# Patient Record
Sex: Male | Born: 1990 | Race: Black or African American | Hispanic: No | Marital: Single
Health system: Midwestern US, Community
[De-identification: ages and names within clinical notes are randomized; demographics above are authoritative.]

## PROBLEM LIST (undated history)

## (undated) DIAGNOSIS — F32A Depression, unspecified: Secondary | ICD-10-CM

## (undated) DIAGNOSIS — R569 Unspecified convulsions: Secondary | ICD-10-CM

## (undated) DIAGNOSIS — F419 Anxiety disorder, unspecified: Secondary | ICD-10-CM

## (undated) DIAGNOSIS — F329 Major depressive disorder, single episode, unspecified: Secondary | ICD-10-CM

---

## 2013-08-10 ENCOUNTER — Emergency Department (HOSPITAL_COMMUNITY)
Admission: EM | Admit: 2013-08-10 | Discharge: 2013-08-10 | Disposition: A | Discharge status: Home / Self Care | Payer: Medicaid Other | Source: Home / Self Care | Attending: Emergency Medicine | Admitting: Emergency Medicine

## 2013-08-10 ENCOUNTER — Emergency Department (HOSPITAL_COMMUNITY)
Admission: EM | Admit: 2013-08-10 | Discharge: 2013-08-10 | Disposition: A | Discharge status: Home / Self Care | Payer: Medicaid Other | Attending: Emergency Medicine | Admitting: Emergency Medicine

## 2013-08-10 ENCOUNTER — Encounter (HOSPITAL_COMMUNITY): Payer: Self-pay | Admitting: Emergency Medicine

## 2013-08-10 ENCOUNTER — Emergency Department (HOSPITAL_COMMUNITY): Payer: Medicaid Other

## 2013-08-10 DIAGNOSIS — R569 Unspecified convulsions: Secondary | ICD-10-CM

## 2013-08-10 DIAGNOSIS — IMO0001 Reserved for inherently not codable concepts without codable children: Secondary | ICD-10-CM

## 2013-08-10 DIAGNOSIS — Z8659 Personal history of other mental and behavioral disorders: Secondary | ICD-10-CM | POA: Insufficient documentation

## 2013-08-10 DIAGNOSIS — F1010 Alcohol abuse, uncomplicated: Secondary | ICD-10-CM | POA: Diagnosis not present

## 2013-08-10 DIAGNOSIS — Z79899 Other long term (current) drug therapy: Secondary | ICD-10-CM | POA: Insufficient documentation

## 2013-08-10 DIAGNOSIS — F1320 Sedative, hypnotic or anxiolytic dependence, uncomplicated: Secondary | ICD-10-CM | POA: Insufficient documentation

## 2013-08-10 DIAGNOSIS — F1210 Cannabis abuse, uncomplicated: Secondary | ICD-10-CM | POA: Insufficient documentation

## 2013-08-10 DIAGNOSIS — F1410 Cocaine abuse, uncomplicated: Secondary | ICD-10-CM

## 2013-08-10 DIAGNOSIS — F17200 Nicotine dependence, unspecified, uncomplicated: Secondary | ICD-10-CM | POA: Insufficient documentation

## 2013-08-10 DIAGNOSIS — G40909 Epilepsy, unspecified, not intractable, without status epilepticus: Secondary | ICD-10-CM | POA: Insufficient documentation

## 2013-08-10 DIAGNOSIS — F10929 Alcohol use, unspecified with intoxication, unspecified: Secondary | ICD-10-CM

## 2013-08-10 HISTORY — DX: Anxiety disorder, unspecified: F41.9

## 2013-08-10 HISTORY — DX: Depression, unspecified: F32.A

## 2013-08-10 HISTORY — DX: Unspecified convulsions: R56.9

## 2013-08-10 HISTORY — DX: Major depressive disorder, single episode, unspecified: F32.9

## 2013-08-10 LAB — URINALYSIS, ROUTINE W REFLEX MICROSCOPIC
Bilirubin Urine: NEGATIVE
GLUCOSE, UA: NEGATIVE mg/dL
Hgb urine dipstick: NEGATIVE
KETONES UR: NEGATIVE mg/dL
LEUKOCYTES UA: NEGATIVE
NITRITE: NEGATIVE
PH: 5.5 (ref 5.0–8.0)
Protein, ur: NEGATIVE mg/dL
SPECIFIC GRAVITY, URINE: 1.01 (ref 1.005–1.030)
Urobilinogen, UA: 0.2 mg/dL (ref 0.0–1.0)

## 2013-08-10 LAB — I-STAT VENOUS BLOOD GAS, ED
ACID-BASE DEFICIT: 4 mmol/L — AB (ref 0.0–2.0)
BICARBONATE: 22 meq/L (ref 20.0–24.0)
O2 Saturation: 62 %
TCO2: 23 mmol/L (ref 0–100)
pCO2, Ven: 41.5 mmHg — ABNORMAL LOW (ref 45.0–50.0)
pH, Ven: 7.332 — ABNORMAL HIGH (ref 7.250–7.300)
pO2, Ven: 34 mmHg (ref 30.0–45.0)

## 2013-08-10 LAB — CBC
HCT: 40.2 % (ref 39.0–52.0)
HEMOGLOBIN: 13.9 g/dL (ref 13.0–17.0)
MCH: 29.7 pg (ref 26.0–34.0)
MCHC: 34.6 g/dL (ref 30.0–36.0)
MCV: 85.9 fL (ref 78.0–100.0)
PLATELETS: 258 10*3/uL (ref 150–400)
RBC: 4.68 MIL/uL (ref 4.22–5.81)
RDW: 12.9 % (ref 11.5–15.5)
WBC: 13.9 10*3/uL — AB (ref 4.0–10.5)

## 2013-08-10 LAB — COMPREHENSIVE METABOLIC PANEL
ALT: 9 U/L (ref 0–53)
ANION GAP: 20 — AB (ref 5–15)
AST: 18 U/L (ref 0–37)
Albumin: 4.8 g/dL (ref 3.5–5.2)
Alkaline Phosphatase: 49 U/L (ref 39–117)
BUN: 10 mg/dL (ref 6–23)
CO2: 21 meq/L (ref 19–32)
CREATININE: 1.21 mg/dL (ref 0.50–1.35)
Calcium: 9.4 mg/dL (ref 8.4–10.5)
Chloride: 102 mEq/L (ref 96–112)
GFR calc Af Amer: >90 mL/min (ref >90)
GFR, EST NON AFRICAN AMERICAN: 83 mL/min — AB (ref >90)
GLUCOSE: 74 mg/dL (ref 70–99)
Potassium: 3.6 mEq/L — ABNORMAL LOW (ref 3.7–5.3)
Sodium: 143 mEq/L (ref 137–147)
Total Bilirubin: 0.6 mg/dL (ref 0.3–1.2)
Total Protein: 7.2 g/dL (ref 6.0–8.3)

## 2013-08-10 LAB — I-STAT CHEM 8, ED
BUN: 12 mg/dL (ref 6–23)
CALCIUM ION: 1.21 mmol/L (ref 1.12–1.23)
Chloride: 103 mEq/L (ref 96–112)
Creatinine, Ser: 1 mg/dL (ref 0.50–1.35)
GLUCOSE: 78 mg/dL (ref 70–99)
HCT: 47 % (ref 39.0–52.0)
HEMOGLOBIN: 16 g/dL (ref 13.0–17.0)
POTASSIUM: 3.4 meq/L — AB (ref 3.7–5.3)
Sodium: 140 mEq/L (ref 137–147)
TCO2: 21 mmol/L (ref 0–100)

## 2013-08-10 LAB — RAPID URINE DRUG SCREEN, HOSP PERFORMED
Amphetamines: NOT DETECTED
Barbiturates: NOT DETECTED
Benzodiazepines: POSITIVE — AB
COCAINE: POSITIVE — AB
Opiates: NOT DETECTED
Tetrahydrocannabinol: POSITIVE — AB

## 2013-08-10 LAB — I-STAT CG4 LACTIC ACID, ED: Lactic Acid, Venous: 1.2 mmol/L (ref 0.5–2.2)

## 2013-08-10 LAB — ETHANOL: ALCOHOL ETHYL (B): 146 mg/dL — AB (ref 0–11)

## 2013-08-10 LAB — PROLACTIN: Prolactin: 14.9 ng/mL (ref 2.1–17.1)

## 2013-08-10 LAB — CK: Total CK: 173 U/L (ref 7–232)

## 2013-08-10 MED ORDER — LEVETIRACETAM IN NACL 1000 MG/100ML IV SOLN
1000.0000 mg | Freq: Once | INTRAVENOUS | Status: AC
  Administered 2013-08-10: 1000 mg via INTRAVENOUS
  Filled 2013-08-10: qty 100

## 2013-08-10 MED ORDER — SODIUM CHLORIDE 0.9 % IV BOLUS (SEPSIS)
1000.0000 mL | Freq: Once | INTRAVENOUS | Status: AC
  Administered 2013-08-10: 1000 mL via INTRAVENOUS

## 2013-08-10 MED ORDER — LEVETIRACETAM 500 MG PO TABS: 500.0000 mg | ORAL_TABLET | Freq: Two times a day (BID) | ORAL | Status: DC

## 2013-08-10 MED ORDER — LEVETIRACETAM 500 MG PO TABS
500.0000 mg | ORAL_TABLET | Freq: Once | ORAL | Status: AC
  Administered 2013-08-10: 500 mg via ORAL
  Filled 2013-08-10: qty 1

## 2013-08-10 MED ORDER — LORAZEPAM 2 MG/ML IJ SOLN
2.0000 mg | INTRAMUSCULAR | Status: AC
  Administered 2013-08-10: 2 mg via INTRAVENOUS

## 2013-08-10 NOTE — ED Notes (Signed)
Patient was just discharged from facility one hour ago for a post ictal state after a seizure. Patient was found lying on the ground 3 blocks from ED. EMS report that there were no witnesses and patient cannot recall if he had a seizure or not. No incontinence. No airway compromise. CBG 68, 112/60, RR 16, HR 90

## 2013-08-10 NOTE — ED Notes (Signed)
Pt refusing to leave at discharge.  [employer] at bedside.  Pt awaken and given paper scrubs.  Discharge instructions read to pt. Pt verbalized understanding.  No e-signature obtained.

## 2013-08-10 NOTE — ED Notes (Signed)
MD at bedside. 

## 2013-08-10 NOTE — ED Notes (Signed)
Ambulated per NT Natasha B without difficulty.

## 2013-08-10 NOTE — Discharge Instructions (Signed)
Alcohol Intoxication °Alcohol intoxication occurs when you drink enough alcohol that it affects your ability to function. It can be mild or very severe. Drinking a lot of alcohol in a short time is called binge drinking. This can be very harmful. Drinking alcohol can also be more dangerous if you are taking medicines or other drugs. Some of the effects caused by alcohol may include: °· Loss of coordination. °· Changes in mood and behavior. °· Unclear thinking. °· Trouble talking (slurred speech). °· Throwing up (vomiting). °· Confusion. °· Slowed breathing. °· Twitching and shaking (seizures). °· Loss of consciousness. °HOME CARE °· Do not drive after drinking alcohol. °· Drink enough water and fluids to keep your pee (urine) clear or pale yellow. Avoid caffeine. °· Only take medicine as told by your doctor. °GET HELP IF: °· You throw up (vomit) many times. °· You do not feel better after a few days. °· You frequently have alcohol intoxication. Your doctor can help decide if you should see a substance use treatment counselor. °GET HELP RIGHT AWAY IF: °· You become shaky when you stop drinking. °· You have twitching and shaking. °· You throw up blood. It may look bright red or like coffee grounds. °· You notice blood in your poop (bowel movements). °· You become lightheaded or pass out (faint). °MAKE SURE YOU:  °· Understand these instructions. °· Will watch your condition. °· Will get help right away if you are not doing well or get worse. °Document Released: 07/06/2007 Document Revised: 09/19/2012 Document Reviewed: 06/22/2012 °ExitCare® Patient Information ©2015 [employer]. This information is not intended to replace advice given to you by your health care provider. Make sure you discuss any questions you have with your health care provider. ° °

## 2013-08-10 NOTE — ED Notes (Signed)
Pt actively seizing for 10 minutes, [facility] called to his home. Pt still seizing when they arrived. Pt received 2.5mg  of versed x 2. Pt complaining of neck pain. Lethargic, post ictal. NPA removed on arrival. Pt on NRB O2 sats 1001%. Family reports pt drank etoh and smoked a "blunt" .

## 2013-08-10 NOTE — ED Provider Notes (Signed)
CSN: [id number]     Arrival date & time    History   First MD Initiated Contact with Patient 08/10/13 0235     Chief Complaint  Patient presents with  . Seizures     (Consider location/radiation/quality/duration/timing/severity/associated sxs/prior Treatment) HPI This patient is a 23 yo man with a reported history of seizures. He is BIB EMS from his home after he developed seizure following alcohol and marijuana use. Family told EMS that the seizure had been ongoing for 15 to 66m by the time EMS arrived. Paramedics state that the patient was having [facility] activity on scene and was treated with Versed 2.5mg  IV. His seizure resolved. However, he had recurrent seizure activity en route which, again, resolved after tx with Versed 2.5mg  IV.   The patient had a third seizure immediately upon arrival to the ED. This seizure was successfully treated within 60s by administration of Ativan 2mg  IV.   The patient is now alert but oriented only to self. Despite being oriented and reoriented, he continues to ask "where am I?".  He has no recall of seizure activity. He denies pain to any region. Denies cocaine use. Denies use of an AED.   The patient began shaking his legs in the ED but, upon request, stopped voluntarily and could open his eyes and converse contemporaneously.     No past medical history on file. No past surgical history on file. No family history on file. History  Substance Use Topics  . Smoking status: Not on file  . Smokeless tobacco: Not on file  . Alcohol Use: Not on file    Review of Systems   UNABLE TO OBTAIN SECONDARY TO POST ICTAL STATE - LEVEL V CAVEAT  Allergies  Review of patient's allergies indicates not on file.  Home Medications   Prior to Admission medications   Not on File   BP 124/79  Pulse 112  Temp(Src) 97.5 F (36.4 C) (Oral)  Resp 13  SpO2 100% Physical Exam  Gen: well developed and well nourished appearing Head: NCAT Eyes: PERL, EOMI Nose:  no epistaixis or rhinorrhea Mouth/throat: mucosa is moist and pink Neck: supple, no stridor, cervical spine is nontender Chest: Chest wall is nontender, no crepitus or skin changes Lungs: CTA B, no wheezing, rhonchi or rales CV: RRR, no murmur, extremities appear well perfused.  Abd: soft, notender, nondistended Back: No tenderness palpation Skin: warm and dry Ext: Nontender, no signs of trauma normal to inspection, no dependent edema Neuro: CN ii-xii grossly intact, speech is slurred, patient can move all 4 extremities on command with fairly good strength. Psyche; normal affect,  calm and cooperative.  ED Course  Procedures (including critical care time) Labs Review  Results for orders placed during the hospital encounter of 08/10/13 (from the past 24 hour(s))  CK     Status: None   Collection Time    08/10/13  2:48 AM      Result Value Ref Range   Total CK 173  7 - 232 U/L  COMPREHENSIVE METABOLIC PANEL     Status: Abnormal   Collection Time    08/10/13  2:48 AM      Result Value Ref Range   Sodium 143  137 - 147 mEq/L   Potassium 3.6 (*) 3.7 - 5.3 mEq/L   Chloride 102  96 - 112 mEq/L   CO2 21  19 - 32 mEq/L   Glucose, Bld 74  70 - 99 mg/dL   BUN 10  6 -  23 mg/dL   Creatinine, Ser 1.611.21  0.50 - 1.35 mg/dL   Calcium 9.4  8.4 - 09.610.5 mg/dL   Total Protein 7.2  6.0 - 8.3 g/dL   Albumin 4.8  3.5 - 5.2 g/dL   AST 18  0 - 37 U/L   ALT 9  0 - 53 U/L   Alkaline Phosphatase 49  39 - 117 U/L   Total Bilirubin 0.6  0.3 - 1.2 mg/dL   GFR calc non Af Amer 83 (*) >90 mL/min   GFR calc Af Amer >90  >90 mL/min   Anion gap 20 (*) 5 - 15  CBC     Status: Abnormal   Collection Time    08/10/13  2:48 AM      Result Value Ref Range   WBC 13.9 (*) 4.0 - 10.5 K/uL   RBC 4.68  4.22 - 5.81 MIL/uL   Hemoglobin 13.9  13.0 - 17.0 g/dL   HCT 04.540.2  40.939.0 - 81.152.0 %   MCV 85.9  78.0 - 100.0 fL   MCH 29.7  26.0 - 34.0 pg   MCHC 34.6  30.0 - 36.0 g/dL   RDW 91.412.9  78.211.5 - 95.615.5 %   Platelets 258  150  - 400 K/uL  ETHANOL     Status: Abnormal   Collection Time    08/10/13  2:48 AM      Result Value Ref Range   Alcohol, Ethyl (B) 146 (*) 0 - 11 mg/dL  I-STAT VENOUS BLOOD GAS, ED     Status: Abnormal   Collection Time    08/10/13  2:56 AM      Result Value Ref Range   pH, Ven 7.332 (*) 7.250 - 7.300   pCO2, Ven 41.5 (*) 45.0 - 50.0 mmHg   pO2, Ven 34.0  30.0 - 45.0 mmHg   Bicarbonate 22.0  20.0 - 24.0 mEq/L   TCO2 23  0 - 100 mmol/L   O2 Saturation 62.0     Acid-base deficit 4.0 (*) 0.0 - 2.0 mmol/L   Sample type VENOUS     Comment NOTIFIED PHYSICIAN    URINALYSIS, ROUTINE W REFLEX MICROSCOPIC     Status: None   Collection Time    08/10/13  3:19 AM      Result Value Ref Range   Color, Urine YELLOW  YELLOW   APPearance CLEAR  CLEAR   Specific Gravity, Urine 1.010  1.005 - 1.030   pH 5.5  5.0 - 8.0   Glucose, UA NEGATIVE  NEGATIVE mg/dL   Hgb urine dipstick NEGATIVE  NEGATIVE   Bilirubin Urine NEGATIVE  NEGATIVE   Ketones, ur NEGATIVE  NEGATIVE mg/dL   Protein, ur NEGATIVE  NEGATIVE mg/dL   Urobilinogen, UA 0.2  0.0 - 1.0 mg/dL   Nitrite NEGATIVE  NEGATIVE   Leukocytes, UA NEGATIVE  NEGATIVE    MDM   Patient is s/p 3 episodes of spontaneous seizure. We are loading with Keppra. Labs notable only for mild leukocytosis. CK is wnl. Will continue to observe until MS has normalized and the patient is able to ambulate safely.   0444: UDS positive for cocaine. BAL elevated. Patient without recurrence of seizure activity. His MS and neurologic exams are normal. He pulled his IV out and has been ambulating around the ED asking to leave. He is stable for discharge. But, in light of his previously diagnosed seizure disorder, I have recommended to the patient that we start tx with Keppra. The  patient is also asked to schedule f/u with [facility] on Monday. Counseled re: return precautions.   Brandt Loosen, MD 08/10/13 [phone]

## 2013-08-10 NOTE — ED Notes (Signed)
[facility]. Pt has been seen in ED x3 in the past 24hrs for same. First dx was sleeping and 2nd dx seizures. Pt was prescribed Keppra but did not get medication filled. EMS arrived on scene and [employer] was called d/t family fighting and agitated. Pt during this time was Responsive to painful stimuli and when told to speak with EMS pt spoke. No postictal phase, pt A&O at this time. VS: BP-120/74 HR-100 CBG-72

## 2013-08-10 NOTE — ED Provider Notes (Signed)
CSN: [id number]     Arrival date & time 08/10/13  1406 History   First MD Initiated Contact with Patient 08/10/13 1413     Chief Complaint  Patient presents with  . Seizures     (Consider location/radiation/quality/duration/timing/severity/associated sxs/prior Treatment) HPI Comments: Possible seizure at home, no witnessed seizure activity  Patient is a 23 y.o. male presenting with seizures. The history is provided by the patient.  Seizures Seizure activity on arrival: no   Preceding symptoms: aura   Initial focality:  None Episode characteristics: fully responsive and responsive   Return to baseline: yes   Severity:  Mild Timing:  Once Progression:  Unchanged PTA treatment:  None History of seizures: yes   Severity:  Moderate Seizure control level:  Uncontrolled Current therapy:  None   Past Medical History  Diagnosis Date  . Seizures   . Anxiety   . Depression    History reviewed. No pertinent past surgical history. No family history on file. History  Substance Use Topics  . Smoking status: Current Every Day Smoker -- 0.50 packs/day    Types: Cigarettes  . Smokeless tobacco: Not on file  . Alcohol Use: Yes     Comment: "sometimes, every week or 2"    Review of Systems  Constitutional: Negative for fever.  Respiratory: Negative for cough and shortness of breath.   Gastrointestinal: Negative for vomiting.  Neurological: Positive for seizures.  All other systems reviewed and are negative.     Allergies  Review of patient's allergies indicates no known allergies.  Home Medications   Prior to Admission medications   Medication Sig Start Date End Date Taking? Authorizing Provider  levETIRAcetam (KEPPRA) 500 MG tablet Take 1 tablet (500 mg total) by mouth 2 (two) times daily. After the first week, increase to 1 and 1/2 tablets (750mg ) twice a day. 08/10/13   Brandt Loosen, MD   BP 114/69  Pulse 89  Temp(Src) 98 F (36.7 C) (Oral)  Resp 16  SpO2  100% Physical Exam  Nursing note and vitals reviewed. Constitutional: He is oriented to person, place, and time. He appears well-developed and well-nourished. No distress.  HENT:  Head: Normocephalic and atraumatic.  Mouth/Throat: Oropharynx is clear and moist. No oropharyngeal exudate.  Eyes: EOM are normal. Pupils are equal, round, and reactive to light.  Neck: Normal range of motion. Neck supple.  Cardiovascular: Normal rate and regular rhythm.  Exam reveals no friction rub.   No murmur heard. Pulmonary/Chest: Effort normal and breath sounds normal. No respiratory distress. He has no wheezes. He has no rales.  Abdominal: He exhibits no distension. There is no tenderness. There is no rebound.  Musculoskeletal: Normal range of motion. He exhibits no edema.  Neurological: He is alert and oriented to person, place, and time.  Skin: He is not diaphoretic.    ED Course  Procedures (including critical care time) Labs Review Labs Reviewed  I-STAT CHEM 8, ED - Abnormal; Notable for the following:    Potassium 3.4 (*)    All other components within normal limits  I-STAT CG4 LACTIC ACID, ED    Imaging Review Dg Chest Port 1 View  08/10/2013   CLINICAL DATA:  Seizure, aspiration  EXAM: PORTABLE CHEST - 1 VIEW  COMPARISON:  None.  FINDINGS: The heart size and mediastinal contours are within normal limits. Both lungs are clear. The visualized skeletal structures are unremarkable. Rightward curvature of the thoracic spine could be positional.  IMPRESSION: No active disease.   Electronically  Signed   By: Christiana PellantGretchen  Green M.D.   On: 08/10/2013 02:54     EKG Interpretation None      MDM   Final diagnoses:  Seizures    23 year old male with history of episodes the presents with possible seizure. He was seen earlier by me for the same. He was found awake after taking a nap and stated he thought he had a seizure. EMS stated patient was presented to be unconscious upon arrival in in he awoke  and spoke to them when they began talking to him. Apparently there was a lot of trauma in a house with people yelling, screaming, fighting. Patient has not filled his Keppra prescription given by Dr. Lavella LemonsManly. Patient given by mouth Here. Labs okay. Of note UDS last night showed positivity for alcohol, cocaine, marijuana. Here he is neurologically intact. Instructed he needs to lay off close to drugs and take his Keppra. Given neurology followup. Patient's family in the room and is comfortable with this plan.    Dagmar HaitWilliam Rodriquez Thorner, MD 08/10/13 1550

## 2013-08-10 NOTE — ED Notes (Signed)
Pts pupils brisk and reactive. Mild sternal rub and pt will open eyes. Not in post ictal state more so sleepy.

## 2013-08-10 NOTE — ED Provider Notes (Signed)
CSN: [id number]     Arrival date & time 08/10/13  [id number] History   First MD Initiated Contact with Patient 08/10/13 0720     Chief Complaint  Patient presents with  . Seizures     (Consider location/radiation/quality/duration/timing/severity/associated sxs/prior Treatment) HPI Comments: Found sleeping on side of road by EMS. Unknown if seizure occurred. Recently discharged (1 hour prior) for seizures - was loaded with Keppra. Hx of noncompliance with his tegretol.   Patient is a 23 y.o. male presenting with seizures. The history is provided by the patient.  Seizures Seizure activity on arrival: no   Seizure type: none. Preceding symptoms: no nausea   Initial focality:  None Return to baseline: yes   Severity:  Mild Progression:  Resolved   Past Medical History  Diagnosis Date  . Seizures    History reviewed. No pertinent past surgical history. No family history on file. History  Substance Use Topics  . Smoking status: Current Every Day Smoker  . Smokeless tobacco: Not on file  . Alcohol Use: Yes    Review of Systems  Constitutional: Negative for fever.  Respiratory: Negative for cough and shortness of breath.   Gastrointestinal: Negative for vomiting.  Neurological: Positive for seizures.  All other systems reviewed and are negative.     Allergies  Review of patient's allergies indicates not on file.  Home Medications   Prior to Admission medications   Medication Sig Start Date End Date Taking? Authorizing Provider  levETIRAcetam (KEPPRA) 500 MG tablet Take 1 tablet (500 mg total) by mouth 2 (two) times daily. After the first week, increase to 1 and 1/2 tablets (750mg ) twice a day. 08/10/13   Brandt LoosenJulie Manly, MD   BP 104/62  Pulse 80  Temp(Src) 97.2 F (36.2 C) (Oral)  Resp 20  SpO2 100% Physical Exam  Nursing note and vitals reviewed. Constitutional: He is oriented to person, place, and time. He appears well-developed and well-nourished. No distress.  HENT:   Head: Normocephalic and atraumatic.  Mouth/Throat: Oropharynx is clear and moist. No oropharyngeal exudate.  Eyes: EOM are normal. Pupils are equal, round, and reactive to light.  Neck: Normal range of motion. Neck supple.  Cardiovascular: Normal rate and regular rhythm.  Exam reveals no friction rub.   No murmur heard. Pulmonary/Chest: Effort normal and breath sounds normal. No respiratory distress. He has no wheezes. He has no rales.  Abdominal: Soft. He exhibits no distension. There is no tenderness. There is no rebound.  Musculoskeletal: Normal range of motion. He exhibits no edema.  Neurological: He is alert and oriented to person, place, and time. He exhibits normal muscle tone. Coordination normal.  Skin: No rash noted. He is not diaphoretic.    ED Course  Procedures (including critical care time) Labs Review Labs Reviewed - No data to display  Imaging Review Dg Chest Port 1 View  08/10/2013   CLINICAL DATA:  Seizure, aspiration  EXAM: PORTABLE CHEST - 1 VIEW  COMPARISON:  None.  FINDINGS: The heart size and mediastinal contours are within normal limits. Both lungs are clear. The visualized skeletal structures are unremarkable. Rightward curvature of the thoracic spine could be positional.  IMPRESSION: No active disease.   Electronically Signed   By: Christiana PellantGretchen  Green M.D.   On: 08/10/2013 02:54     EKG Interpretation   Date/Time:  Saturday August 10 2013 06:53:04 EDT Ventricular Rate:  89 PR Interval:  143 QRS Duration: 110 QT Interval:  401 QTC Calculation: 488 R Axis:  80 Text Interpretation:  Sinus rhythm Consider right atrial enlargement  Borderline prolonged QT interval Similar to prior Confirmed by Gwendolyn Grant  MD,  Dakota Vanwart ([id number]) on 08/10/2013 8:08:44 AM      MDM   Final diagnoses:  Sleeping    71M presents with possible seizure. He was seen previously by Dr. Lavella Lemons after having multiple seizures. Per her note, he had some voluntary seizure activity at a stop when  you talk to him. He reports noncompliance with his Tegretol over the past 6 months. He was loaded with Keppra and then given by mouth Keppra prescription to go home. He was found by EMS sleeping several blocks away from the hospital. There is unknown seizure activity. Patient reports he might have had a seizure. Here he is sleeping comfortably. He is easily arousable and follows commands. He has normal neuro exam here. Patient has discharge papers with his Keppra prescription. I believe patient was likely sleeping as he got multiple benzodiazepine doses. No seizure activity here with me. Stable for discharge.    Dagmar Hait, MD 08/10/13 [phone]

## 2013-08-10 NOTE — Discharge Instructions (Signed)
Nonepileptic Seizures °Nonepileptic seizures are seizures that are not caused by abnormal electrical signals in your brain. These seizures often seem like epileptic seizures, but they are not caused by epilepsy.  °There are two types of nonepileptic seizures: °· A physiologic nonepileptic seizure results from a disruption in your brain. °· A psychogenic seizure results from emotional stress. These seizures are sometimes called pseudoseizures. °CAUSES  °Causes of physiologic nonepileptic seizures include:  °· Sudden drop in blood pressure. °· Low blood sugar. °· Low levels of salt (sodium) in your blood. °· Low levels of calcium in your blood. °· Migraine. °· Heart rhythm problems. °· Sleep disorders. °· Drug and alcohol abuse. °Common causes of psychogenic nonepileptic seizures include: °· Stress. °· Emotional trauma. °· Sexual or physical abuse. °· Major life events, such as divorce or the death of a loved one. °· Mental health disorders, including panic attack and hyperactivity disorder. °SIGNS AND SYMPTOMS °A nonepileptic seizure can look like an epileptic seizure, including uncontrollable shaking (convulsions), or changes in attention, behavior, or the ability to remain awake and alert. However, there are some differences. Nonepileptic seizures usually: °· Do not cause physical injuries. °· Start slowly. °· Include crying or shrieking. °· Last longer than 2 minutes. °· Have a short recovery time without headache or exhaustion. °DIAGNOSIS  °Your health care provider can usually diagnose nonepileptic seizures after taking your medical history and giving you a physical exam. Your health care provider may want to talk to your friends or relatives who have seen you have a seizure.  °You may also need to have tests to look for causes of physiologic nonepileptic seizures. This may include an electroencephalogram (EEG), which is a test that measures electrical activity in your brain. If you have had an epileptic  seizure, the results of your EEG will be abnormal. If your health care provider thinks you have had a psychogenic nonepileptic seizure, you may need to see a mental health specialist for an evaluation. °TREATMENT  °Treatment depends on the type and cause of your seizures. °· For physiologic nonepileptic seizures, treatment is aimed at addressing the underlying condition that caused the seizures. These seizures usually stop when the underlying condition is properly treated. °· Nonepileptic seizures do not respond to the seizure medicines used to treat epilepsy. °· For psychogenic seizures, you may need to work with a mental health specialist. °HOME CARE INSTRUCTIONS °Home care will depend on the type of nonepileptic seizures you have.  °· Follow all your health care provider's instructions. °· Keep all your follow-up appointments. °SEEK MEDICAL CARE IF: °You continue to have seizures after treatment. °SEEK IMMEDIATE MEDICAL CARE IF: °· Your seizures change or become more frequent. °· You injure yourself during a seizure. °· You have one seizure after another. °· You have trouble recovering from a seizure. °· You have chest pain or trouble breathing. °MAKE SURE YOU: °· Understand these instructions. °· Will watch your condition. °· Will get help right away if you are not doing well or get worse. °Document Released: 03/04/2005 Document Revised: 01/22/2013 Document Reviewed: 11/13/2012 °[employer]® Patient Information ©2015 [employer]. This information is not intended to replace advice given to you by your health care provider. Make sure you discuss any questions you have with your health care provider. ° °

## 2014-12-26 ENCOUNTER — Encounter (HOSPITAL_COMMUNITY): Payer: Self-pay | Admitting: Vascular Surgery

## 2014-12-26 ENCOUNTER — Emergency Department (HOSPITAL_COMMUNITY)
Admission: EM | Admit: 2014-12-26 | Discharge: 2014-12-26 | Discharge status: Against Medical Advice | Payer: Medicaid Other | Attending: Emergency Medicine | Admitting: Emergency Medicine

## 2014-12-26 DIAGNOSIS — F17210 Nicotine dependence, cigarettes, uncomplicated: Secondary | ICD-10-CM | POA: Insufficient documentation

## 2014-12-26 DIAGNOSIS — R1084 Generalized abdominal pain: Secondary | ICD-10-CM | POA: Diagnosis not present

## 2014-12-26 LAB — CBC
HEMATOCRIT: 40.3 % (ref 39.0–52.0)
HEMOGLOBIN: 13.5 g/dL (ref 13.0–17.0)
MCH: 29.4 pg (ref 26.0–34.0)
MCHC: 33.5 g/dL (ref 30.0–36.0)
MCV: 87.8 fL (ref 78.0–100.0)
Platelets: 268 10*3/uL (ref 150–400)
RBC: 4.59 MIL/uL (ref 4.22–5.81)
RDW: 12.8 % (ref 11.5–15.5)
WBC: 20.8 10*3/uL — ABNORMAL HIGH (ref 4.0–10.5)

## 2014-12-26 LAB — COMPREHENSIVE METABOLIC PANEL
ALBUMIN: 4.2 g/dL (ref 3.5–5.0)
ALK PHOS: 61 U/L (ref 38–126)
ALT: 11 U/L — ABNORMAL LOW (ref 17–63)
ANION GAP: 8 (ref 5–15)
AST: 15 U/L (ref 15–41)
BUN: 8 mg/dL (ref 6–20)
CALCIUM: 9.3 mg/dL (ref 8.9–10.3)
CO2: 25 mmol/L (ref 22–32)
Chloride: 98 mmol/L — ABNORMAL LOW (ref 101–111)
Creatinine, Ser: 1.02 mg/dL (ref 0.61–1.24)
GFR calc non Af Amer: >60 mL/min (ref >60)
Glucose, Bld: 120 mg/dL — ABNORMAL HIGH (ref 65–99)
POTASSIUM: 3.5 mmol/L (ref 3.5–5.1)
SODIUM: 131 mmol/L — AB (ref 135–145)
TOTAL PROTEIN: 6.8 g/dL (ref 6.5–8.1)
Total Bilirubin: 0.7 mg/dL (ref 0.3–1.2)

## 2014-12-26 LAB — LIPASE, BLOOD: Lipase: 20 U/L (ref 11–51)

## 2014-12-26 NOTE — ED Notes (Signed)
Pt reports to the ED for eval of generalized abd pain that he first noticed today when he woke up. Pt reports associated N/V, intermittent dysuria, and chills. Denies diarrhea/hematemesis. Unknown fever. Pt A&OX4, resp e/u, and skin warm and dry.

## 2015-01-16 ENCOUNTER — Encounter (HOSPITAL_COMMUNITY): Payer: Self-pay | Admitting: Emergency Medicine

## 2015-01-16 ENCOUNTER — Emergency Department (HOSPITAL_COMMUNITY)
Admission: EM | Admit: 2015-01-16 | Discharge: 2015-01-17 | Disposition: A | Discharge status: Home / Self Care | Payer: Medicaid Other | Attending: Emergency Medicine | Admitting: Emergency Medicine

## 2015-01-16 DIAGNOSIS — S60511A Abrasion of right hand, initial encounter: Secondary | ICD-10-CM | POA: Diagnosis not present

## 2015-01-16 DIAGNOSIS — X58XXXA Exposure to other specified factors, initial encounter: Secondary | ICD-10-CM | POA: Insufficient documentation

## 2015-01-16 DIAGNOSIS — R000 Tachycardia, unspecified: Secondary | ICD-10-CM | POA: Insufficient documentation

## 2015-01-16 DIAGNOSIS — Y998 Other external cause status: Secondary | ICD-10-CM | POA: Insufficient documentation

## 2015-01-16 DIAGNOSIS — Y9389 Activity, other specified: Secondary | ICD-10-CM | POA: Insufficient documentation

## 2015-01-16 DIAGNOSIS — S0993XA Unspecified injury of face, initial encounter: Secondary | ICD-10-CM | POA: Diagnosis not present

## 2015-01-16 DIAGNOSIS — T1490XA Injury, unspecified, initial encounter: Secondary | ICD-10-CM

## 2015-01-16 DIAGNOSIS — S3992XA Unspecified injury of lower back, initial encounter: Secondary | ICD-10-CM | POA: Diagnosis not present

## 2015-01-16 DIAGNOSIS — F17210 Nicotine dependence, cigarettes, uncomplicated: Secondary | ICD-10-CM | POA: Diagnosis not present

## 2015-01-16 DIAGNOSIS — F10120 Alcohol abuse with intoxication, uncomplicated: Secondary | ICD-10-CM | POA: Diagnosis not present

## 2015-01-16 DIAGNOSIS — F10129 Alcohol abuse with intoxication, unspecified: Secondary | ICD-10-CM | POA: Diagnosis present

## 2015-01-16 DIAGNOSIS — F329 Major depressive disorder, single episode, unspecified: Secondary | ICD-10-CM | POA: Insufficient documentation

## 2015-01-16 DIAGNOSIS — F10920 Alcohol use, unspecified with intoxication, uncomplicated: Secondary | ICD-10-CM

## 2015-01-16 DIAGNOSIS — R569 Unspecified convulsions: Secondary | ICD-10-CM | POA: Insufficient documentation

## 2015-01-16 DIAGNOSIS — S50811A Abrasion of right forearm, initial encounter: Secondary | ICD-10-CM | POA: Diagnosis not present

## 2015-01-16 DIAGNOSIS — S6992XA Unspecified injury of left wrist, hand and finger(s), initial encounter: Secondary | ICD-10-CM | POA: Insufficient documentation

## 2015-01-16 DIAGNOSIS — Y9289 Other specified places as the place of occurrence of the external cause: Secondary | ICD-10-CM | POA: Diagnosis not present

## 2015-01-16 NOTE — ED Notes (Addendum)
Patient presents via EMS for ETOH and "seizure like activity". No oral trauma, no incontinence. No c/c.  18g left forearm, 500cc NS  Last VS 124/83, 92hr, 100%ra, 20resp, 98cbg

## 2015-01-16 NOTE — ED Provider Notes (Signed)
CSN: [id number]     Arrival date & time 01/16/15  2338 History  By signing my name below, I, Malik Hayes, attest that this documentation has been prepared under the direction and in the presence of Malik KaplanAnkit Axzel Rockhill, MD. Electronically Signed: [name], ED Scribe. 01/17/2015. 3:20 AM.      Chief Complaint  Patient presents with  . Alcohol Intoxication   Patient is a 24 y.o. male presenting with intoxication. The history is provided by the patient. No language interpreter was used.  Alcohol Intoxication   HPI Comments: Malik MedinaRyan Hayes is a 24 y.o. male brought in by ambulance, who presents to the Emergency Department complaining of ETOH and possible seizure like activity. Pt is not currently on taking seizure medication. Pt states that he has not been complaint with taking his Keppra - because allegedly Malik Hayes doctors asked him to stop it. Pt is complaining of hand pain and jaw pain but is unsure how he injured him self. Pt presents with several small abrasion to his right hand. Pt later states that he may have slid down a flight of steps. Pt states that he is unsure if he had a seizure. Pt denies any drug use today.   Per EMS report, pt was at a home with party going on, and he had seizure like activity.    Past Medical History  Diagnosis Date  . Seizures (HCC)   . Anxiety   . Depression    History reviewed. No pertinent past surgical history. No family history on file. Social History  Substance Use Topics  . Smoking status: Current Every Day Smoker -- 0.50 packs/day    Types: Cigarettes  . Smokeless tobacco: None  . Alcohol Use: Yes     Comment: "sometimes, every week or 2"    Review of Systems  Musculoskeletal: Positive for arthralgias.  Skin: Positive for wound.    ROS 10 Systems reviewed and are negative for acute change except as noted in the HPI.    Allergies  Review of patient's allergies indicates no known allergies.  Home Medications   Prior to Admission  medications   Medication Sig Start Date End Date Taking? Authorizing Provider  levETIRAcetam (KEPPRA) 500 MG tablet Take 1 tablet (500 mg total) by mouth 2 (two) times daily. 01/17/15   Lucita Montoya, MD   BP 108/62 mmHg  Pulse 95  Temp(Src) 98.3 F (36.8 C) (Oral)  Resp 22  SpO2 97%   Physical Exam  Constitutional: He is oriented to person, place, and time. He appears well-developed and well-nourished. No distress.  HENT:  Head: Normocephalic and atraumatic.  Mouth/Throat: No trismus in the jaw.  Tenderness over the TMJ bilaterally. No step off.  Eyes: Conjunctivae and EOM are normal.  Pupils 5 mm. No nystagmus.   Neck: Neck supple. No tracheal deviation present.  Cardiovascular: Tachycardia present.   No murmur heard. Pulmonary/Chest: Effort normal. No respiratory distress.  Musculoskeletal: Normal range of motion.  Pt has abrasion to the right forearm and right hand. Tenderness over the left thumb and left long finger, no gross deformity of left hand, no tenderness of wrist.  Tenderness over the small digit and the associated metacarpal region of the right hand.  Tenderness over the sacral spine with no step off, positive parapaspinal  tendeness in the same region with no ecchymosis. No tendeness with internmal and extreanl rotaion of the hips bilaterally   Neurological: He is alert and oriented to person, place, and time.  Skin: Skin is  warm and dry.  Psychiatric: He has a normal mood and affect. His behavior is normal.  Nursing note and vitals reviewed.   ED Course  Procedures (including critical care time) DIAGNOSTIC STUDIES: Oxygen Saturation is 99% on RA, normal by my interpretation.    COORDINATION OF CARE: 12:02 AM-Discussed treatment plan with pt at bedside and pt agreed to plan.  2:09 AM-Reassessed pt and spoke with pt's mother she agrees with plan to give IV Keppra and discharge. She reports hx of seizure like activity, but that pt was told that his seizures  might not be epilepsy or real seizures events. (?psuedoseizures). We discussed the treatment option of keppra bolus and rx with Neuro f/u VS no keppra and Neuro f/u with ER return if there is any repeat episodes. She prefers getting keppra on board as she isn't 100% sure on the seizure status.    Labs Review Labs Reviewed  CBC WITH DIFFERENTIAL/PLATELET - Abnormal; Notable for the following:    Hemoglobin 12.9 (*)    All other components within normal limits  BASIC METABOLIC PANEL - Abnormal; Notable for the following:    Chloride 113 (*)    Calcium 8.6 (*)    All other components within normal limits  ETHANOL - Abnormal; Notable for the following:    Alcohol, Ethyl (B) 204 (*)    All other components within normal limits    Imaging Review Dg Hand Complete Left  01/17/2015  CLINICAL DATA:  Bilateral hand pain with abrasions on the right hand. Possible seizure or fall. EXAM: LEFT HAND - COMPLETE 3+ VIEW COMPARISON:  None. FINDINGS: There is no evidence of fracture or dislocation. There is no evidence of arthropathy or other focal bone abnormality. Soft tissues are unremarkable. IMPRESSION: Negative. Electronically Signed   By: Burman Nieves M.D.   On: 01/17/2015 00:45   Dg Hand Complete Right  01/17/2015  CLINICAL DATA:  Right hand pain after injury. EXAM: RIGHT HAND - COMPLETE 3+ VIEW COMPARISON:  None. FINDINGS: No fracture or dislocation. The alignment and joint spaces are maintained. No radiopaque foreign body or focal soft tissue abnormality. IMPRESSION: No fracture or subluxation of the right hand. Electronically Signed   By: Rubye Oaks M.D.   On: 01/17/2015 00:47      EKG Interpretation None      MDM   Final diagnoses:  Trauma  Seizure-like activity (HCC)  Alcohol intoxication, uncomplicated (HCC)    I personally performed the services described in this documentation, which was scribed in my presence. The recorded information has been reviewed and is  accurate.  Pt comes in with cc of seizure like activity. Pt is not the best of historian, and he is intoxicated. He alleged that he was diagnosed with seizures, but then the [facility] doctors asked him to stop keppra as he was diagnosed with anxiety based on EEG. He hasnt been taking keppra for a while now, admits to having alcohol today. He has visible abrasion to his hands, and also c/o jaw pain - but he has no recollection how he had that. DDX: Syncope vs. Seizure.  Will get basic labs, load with keppra. We will starthim on keppra and give him neuro f.u.      Malik Kaplan, MD 01/17/15 [phone]

## 2015-01-17 ENCOUNTER — Emergency Department (HOSPITAL_COMMUNITY): Payer: Medicaid Other

## 2015-01-17 LAB — CBC WITH DIFFERENTIAL/PLATELET
Basophils Absolute: 0 10*3/uL (ref 0.0–0.1)
Basophils Relative: 0 %
EOS PCT: 2 %
Eosinophils Absolute: 0.1 10*3/uL (ref 0.0–0.7)
HCT: 39 % (ref 39.0–52.0)
HEMOGLOBIN: 12.9 g/dL — AB (ref 13.0–17.0)
LYMPHS PCT: 27 %
Lymphs Abs: 1.9 10*3/uL (ref 0.7–4.0)
MCH: 29.3 pg (ref 26.0–34.0)
MCHC: 33.1 g/dL (ref 30.0–36.0)
MCV: 88.4 fL (ref 78.0–100.0)
MONO ABS: 0.8 10*3/uL (ref 0.1–1.0)
Monocytes Relative: 11 %
NEUTROS ABS: 4.3 10*3/uL (ref 1.7–7.7)
NEUTROS PCT: 60 %
PLATELETS: 304 10*3/uL (ref 150–400)
RBC: 4.41 MIL/uL (ref 4.22–5.81)
RDW: 13.4 % (ref 11.5–15.5)
WBC: 7.1 10*3/uL (ref 4.0–10.5)

## 2015-01-17 LAB — BASIC METABOLIC PANEL
Anion gap: 8 (ref 5–15)
BUN: 8 mg/dL (ref 6–20)
CHLORIDE: 113 mmol/L — AB (ref 101–111)
CO2: 24 mmol/L (ref 22–32)
Calcium: 8.6 mg/dL — ABNORMAL LOW (ref 8.9–10.3)
Creatinine, Ser: 0.97 mg/dL (ref 0.61–1.24)
GFR calc Af Amer: >60 mL/min (ref >60)
GFR calc non Af Amer: >60 mL/min (ref >60)
GLUCOSE: 99 mg/dL (ref 65–99)
POTASSIUM: 3.8 mmol/L (ref 3.5–5.1)
Sodium: 145 mmol/L (ref 135–145)

## 2015-01-17 LAB — ETHANOL: Alcohol, Ethyl (B): 204 mg/dL — ABNORMAL HIGH (ref <5)

## 2015-01-17 MED ORDER — LEVETIRACETAM 500 MG PO TABS: 500.0000 mg | ORAL_TABLET | Freq: Two times a day (BID) | ORAL | Status: AC

## 2015-01-17 MED ORDER — SODIUM CHLORIDE 0.9 % IV SOLN
1000.0000 mg | Freq: Two times a day (BID) | INTRAVENOUS | Status: DC
  Administered 2015-01-17: 1000 mg via INTRAVENOUS
  Filled 2015-01-17: qty 10

## 2015-01-17 NOTE — Discharge Instructions (Signed)

## 2015-01-17 NOTE — ED Notes (Signed)
Patient is alert and oriented to self only. Able to ambulate steadily without assistance. Mother at bedside to transport home.

## 2015-12-06 ENCOUNTER — Inpatient Hospital Stay
Admit: 2015-12-06 | Discharge: 2015-12-09 | Disposition: A | Discharge status: Home / Self Care | Payer: MEDICARE | Source: Other Acute Inpatient Hospital | Attending: Psychiatry | Admitting: Psychiatry

## 2015-12-06 DIAGNOSIS — F329 Major depressive disorder, single episode, unspecified: Secondary | ICD-10-CM

## 2015-12-06 MED ORDER — BENZTROPINE 1 MG/ML IJ SOLN
1 mg/mL | Freq: Two times a day (BID) | INTRAMUSCULAR | Status: DC | PRN
Start: 2015-12-06 — End: 2015-12-09

## 2015-12-06 MED ORDER — OLANZAPINE 5 MG TAB
5 mg | Freq: Four times a day (QID) | ORAL | Status: DC | PRN
Start: 2015-12-06 — End: 2015-12-09
  Administered 2015-12-06 – 2015-12-07 (×2): via ORAL

## 2015-12-06 MED ORDER — LORAZEPAM 1 MG TAB
1 mg | ORAL | Status: DC | PRN
Start: 2015-12-06 — End: 2015-12-08
  Administered 2015-12-06 – 2015-12-08 (×4): via ORAL

## 2015-12-06 MED ORDER — WATER FOR INJECTION, STERILE INJECTION
20 mg/mL (final conc.) | Freq: Two times a day (BID) | INTRAMUSCULAR | Status: DC | PRN
Start: 2015-12-06 — End: 2015-12-09
  Administered 2015-12-08: 17:00:00 via INTRAMUSCULAR

## 2015-12-06 MED ORDER — ACETAMINOPHEN 325 MG TABLET
325 mg | ORAL | Status: DC | PRN
Start: 2015-12-06 — End: 2015-12-09

## 2015-12-06 MED ORDER — FLU VACCINE QV 2017-18 (36 MOS+)(PF) 60 MCG (15 MCG X 4)/0.5 ML IM SYRINGE
60 mcg (15 mcg x 4)/0.5 mL | INTRAMUSCULAR | Status: DC
Start: 2015-12-06 — End: 2015-12-09

## 2015-12-06 MED ORDER — LORAZEPAM 2 MG/ML IJ SOLN
2 mg/mL | INTRAMUSCULAR | Status: DC | PRN
Start: 2015-12-06 — End: 2015-12-09
  Administered 2015-12-08: 17:00:00 via INTRAMUSCULAR

## 2015-12-06 MED ORDER — BENZTROPINE 2 MG TAB
2 mg | Freq: Two times a day (BID) | ORAL | Status: DC | PRN
Start: 2015-12-06 — End: 2015-12-09

## 2015-12-06 MED ORDER — ZOLPIDEM 10 MG TAB
10 mg | Freq: Every evening | ORAL | Status: DC | PRN
Start: 2015-12-06 — End: 2015-12-09
  Administered 2015-12-09: 02:00:00 via ORAL

## 2015-12-06 MED ORDER — IBUPROFEN 400 MG TAB
400 mg | Freq: Three times a day (TID) | ORAL | Status: DC | PRN
Start: 2015-12-06 — End: 2015-12-09

## 2015-12-06 MED ORDER — NICOTINE 21 MG/24 HR DAILY PATCH
21 mg/24 hr | Freq: Every day | TRANSDERMAL | Status: DC | PRN
Start: 2015-12-06 — End: 2015-12-09

## 2015-12-06 MED ORDER — MAGNESIUM HYDROXIDE 400 MG/5 ML ORAL SUSP
400 mg/5 mL | Freq: Every day | ORAL | Status: DC | PRN
Start: 2015-12-06 — End: 2015-12-09

## 2015-12-06 MED FILL — LORAZEPAM 1 MG TAB: 1 mg | ORAL | Qty: 1

## 2015-12-06 MED FILL — OLANZAPINE 5 MG TAB: 5 mg | ORAL | Qty: 1

## 2015-12-06 MED FILL — FLUARIX QUAD 2017-2018 (PF) 60 MCG (15 MCG X 4)/0.5 ML IM SYRINGE: 60 mcg (15 mcg x 4)/0.5 mL | INTRAMUSCULAR | Qty: 0.5

## 2015-12-06 NOTE — Consults (Signed)
[facility]   [address]   [place], [place] [id number]   CONSULTATION       Name:  [name] MedinaBROWER, [name]   MR#:  [id number]   DOB:  [birth year]   Account #:  [id number]    Date of Consultation:  12/06/2015   Date of Adm:  12/06/2015       ATTENDING PHYSICIAN: Dr. Wallace CullensGray      PRESENTING COMPLAINT:  PSYCHE HISTORY & PHYSICAL    HISTORY OF PRESENTING COMPLAINT   The patient is a [age] year old male with a history of   depression and polysubstance abuse, who was admitted to psychiatric   inpatient for homicidal ideation. The patient stated that he was   homicidal towards his father's friend. At this time, the patient is not   suicidal or homicidal. He denied any chest pain, chest tightness,   shortness of breath, dyspnea on exertion, PND, or orthopnea. He also   denied diaphoresis, wheezing, or rhonchi. There is no palpitations,   irregular heartbeat, anxiety, or depression. The patient reports that he   takes Celexa for depression. The last time he took was 2 days ago.   The patient drinks alcohol 2 times a week as per patient account. He   usually drinks 4 to 5 beers when he drinks. He uses cocaine and   marijuana daily. The patient smokes about 5 cigarettes daily. There is   no report of cough, nasal congestion, sore throat, epistaxis, or   rhinorrhea. The patient has no complaints of fever, chills, or rigors. He   denied headache, lightheadedness, dizziness, blurry vision, double   vision, syncopal episode, or seizures. There is no neck pain, neck   stiffness, or confusion. The patient has no back pain or flank pain. He   denied dysuria, frequency, hematuria, or urethral discharge. There is   no lower extremity pain or swelling. There is no report of nausea,   vomiting, abdominal pain, constipation, or diarrhea. The patient has no   loss of appetite, dysphagia or odynophagia. He denied being   depressed or anxious at this time. The patient has no report of any skin   lesions or rash.    PAST MEDICAL HISTORY   1.   Depression.   2.  Tobacco abuse.   3.  Cocaine and marijuana abuse.   4.  Binges on alcohol occasionally.    MEDICATIONS   1.  Citalopram 40 mg daily.   2.  Hydroxyzine pamoate 25 mg q.8h. p.r.n. for anxiety.    ALLERGIES: NO KNOWN DRUG ALLERGIES.    SURGICAL HISTORY: None.    FAMILY HISTORY: Significant for diabetes, hypertension, and colon   cancer.     REVIEW OF SYSTEMS: More than 12 systems reviewed, and the   pertinent positives are in the history of present illness. All others are   negative.    PHYSICAL EXAMINATION   GENERAL: The patient is seen lying in bed in no acute respiratory   distress.   VITAL SIGNS: Blood pressure 112/84, pulse 69, respirations 18,   temperature 98.5, pulse oximetry 100% on room air.   HEAD, EYES, EARS, NOSE, AND THROAT: Atraumatic,   normocephalic. not pale, not jaundiced. Extraocular muscles intact.   Pupils bilaterally reactive, equal, round.   NECK: Supple. No JVD, no carotid bruit.   HEART: Sounds 1 and 2, regular rate and rhythm. No gallop, no   friction, no rub.   RESPIRATION: Good air entry bilaterally. Clear to auscultation.  ABDOMEN: Soft, nontender. Bowel sounds normoactive.   NEUROLOGIC: Alert, oriented x3. Cranial nerves 2-12 intact.   SKIN: Warm, dry, normal skin color, normal skin turgor.   PSYCHIATRIC: Normal mood, normal affect.   BACK: No CVA tenderness.   EXTREMITIES: No edema, no cyanosis. Normal range of motion.    DIAGNOSTIC TESTS: Acetaminophen less than 2, alcohol level 120.   WBC 9.64, hemoglobin 16.3, hematocrit 45.6, platelets 296. BUN 9,   creatinine 0.98, sodium 140, potassium 3.9, chloride 108, carbon   dioxide 25, glucose 118. ALT 22, AST 20, alkaline phosphatase 73.   CPK 259. Salicylate level 2. Troponin negative. Urine yellow, clear,   specific gravity 1.030, trace protein, negative glucose, negative nitrites,   negative leukocyte esterase. Urine drug screen is positive for cocaine   and cannabinoids.    ASSESSMENT   1.  Depression with anxiety.    2.  Tobacco abuse.   3.  Polysubstance abuse, including marijuana and cocaine.   4.  Alcohol binge history.    PLAN: Admission orders as per attending physician. The patient is   currently on Ativan p.r.n. He is on psychiatric medications including   Cogentin, Geodon, and Zyprexa. The patient is also on nicotine patch.   I agree with Tylenol p.r.n. Extensive patient education including   smoking cessation and counseling was done by bedside. Further   Pt education including polysubstance abuse and alcohol use was also   done by bedside. I encouraged the patient to drink adequate fluid   including water. We will obtain basic labs including CBC, CMP, and   TSH, as per protocol. Monitor vital signs, including blood pressure as   per unit protocol. Fall precautions, skin care precautions. Activity as   tolerated as per unit protocol. DVT prophylaxis as per attending   physician. I discussed advance directive with the patient. The father is   the next of kin. CODE IS FULL CODE. Further management will   depend on the patient's clinical progression, further evaluation by   attending physician, the results of tests, and recommendations by   consulting hospitalist. Had extensive discussion with the nursing staff.   They will call hospitalist team after obtaining the results of above labs.     Thank you for involving the hospitalist team in taking care of this very   nice patient.        Rutherford LimerickSAMUEL G. Genean Adamski, MD      SU / SB   D:  12/06/2015   03:10   T:  12/06/2015   10:00   Job #:  [id number]

## 2015-12-06 NOTE — Progress Notes (Signed)
.  The ending of Daylight Saving Time occurred at 0200 hrs. Documentation of patient care and medications administered is done with respect to the time change.

## 2015-12-06 NOTE — Behavioral Health Treatment Team (Signed)
Pt seen by Dr. Alphonsa GinUmesegha for H & P.

## 2015-12-06 NOTE — Behavioral Health Treatment Team (Addendum)
NSG ADMISSION NOTE:  Pt arrived to [facility] under a [place] TDO.  Pt is ambulatory with steady gait.  Pt is pleasant and cooperative.  Anxious with good eye contact.  Denies present suicide and homicide ideations.  Stated he [facility] because he was homicidal against his father's neighbor.  Pt stated if he went to the man's house he had "too many people there and I might get beat up so I went home and told them I needed to go to the hospital."  States he did not want to see anyone until "I got my meds straight."  Pt reports being admitted to IP psych "2 weeks ago" for cutting wrist. States "you can hardly see it now."  Reports he was prescribed Celexa and anti anxiety medication but stopped taking "2 days ago."    Out patient psych with Dr. Allena [place]Patel in [place].  Has no PCP.  Guarded with information surrounding drug use.  States he did not want to talk about it.  BAL 120 @ 10:08am yesterday in ED.  + cocaine, + THC.   Smokes cigarettes but states "it costs too much" and stated he needed to stop.  Benefits of smoking cessation discussed with patient and discussed handout provided in admission packet.    Pt is on disability and lives alone in his own one story home.  Has a father who lives in the area who is supportive but he did not want to talk to or see anyone [facility].  Oriented to unit.  Placed on q15 min checks for safety.

## 2015-12-06 NOTE — Behavioral Health Treatment Team (Signed)
1338: TDO hearing scheduled for Monday 12/07/15 at 0730. All parties have been notified.

## 2015-12-06 NOTE — Progress Notes (Signed)
Primary Nurse Cleta AlbertsPatricia L Hardwicke, RN and Darcel SmallingPat Brown, RN performed a dual skin assessment on this patient No impairment noted  Braden score is 23    One tattoo Rt upper arm      No pressure ulcer noted and pressure ulcer prevention initiated.

## 2015-12-06 NOTE — Consults (Signed)
[place] [facility]   [address]   [place], [place] [id number]   CONSULTATION       Name:  [name] Richardson, [name] Richardson   MR#:  [id number]   DOB:  10-18-1990   Account #:  [id number]    Date of Consultation:  12/06/2015   Date of Adm:  12/06/2015       ATTENDING PHYSICIAN: Dr. Wallace CullensGray      PRESENTING COMPLAINT:  PSYCHE HISTORY & PHYSICAL    HISTORY OF PRESENTING COMPLAINT   The patient is a 25 year old male with a history of   depression and polysubstance abuse, who was admitted to psychiatric   inpatient for homicidal ideation. The patient stated that he was   homicidal towards his father's friend. At this time, the patient is not   suicidal or homicidal. He denied any chest pain, chest tightness,   shortness of breath, dyspnea on exertion, PND, or orthopnea. He also   denied diaphoresis, wheezing, or rhonchi. There is no palpitations,   irregular heartbeat, anxiety, or depression. The patient reports that he   takes Celexa for depression. The last time he took was 2 days ago.   The patient drinks alcohol 2 times a week as per patient account. He   usually drinks 4 to 5 beers when he drinks. He uses cocaine and   marijuana daily. The patient smokes about 5 cigarettes daily. There is   no report of cough, nasal congestion, sore throat, epistaxis, or   rhinorrhea. The patient has no complaints of fever, chills, or rigors. He   denied headache, lightheadedness, dizziness, blurry vision, double   vision, syncopal episode, or seizures. There is no neck pain, neck   stiffness, or confusion. The patient has no back pain or flank pain. He   denied dysuria, frequency, hematuria, or urethral discharge. There is   no lower extremity pain or swelling. There is no report of nausea,   vomiting, abdominal pain, constipation, or diarrhea. The patient has no   loss of appetite, dysphagia or odynophagia. He denied being   depressed or anxious at this time. The patient has no report of any skin   lesions or rash.    PAST MEDICAL HISTORY    1.  Depression.   2.  Tobacco abuse.   3.  Cocaine and marijuana abuse.   4.  Binges on alcohol occasionally.    MEDICATIONS   1.  Citalopram 40 mg daily.   2.  Hydroxyzine pamoate 25 mg q.8h. p.r.n. for anxiety.    ALLERGIES: NO KNOWN DRUG ALLERGIES.    SURGICAL HISTORY: None.    FAMILY HISTORY: Significant for diabetes, hypertension, and colon   cancer.     REVIEW OF SYSTEMS: More than 12 systems reviewed, and the   pertinent positives are in the history of present illness. All others are   negative.    PHYSICAL EXAMINATION   GENERAL: The patient is seen lying in bed in no acute respiratory   distress.   VITAL SIGNS: Blood pressure 112/84, pulse 69, respirations 18,   temperature 98.5, pulse oximetry 100% on room air.   HEAD, EYES, EARS, NOSE, AND THROAT: Atraumatic,   normocephalic. not pale, not jaundiced. Extraocular muscles intact.   Pupils bilaterally reactive, equal, round.   NECK: Supple. No JVD, no carotid bruit.   HEART: Sounds 1 and 2, regular rate and rhythm. No gallop, no   friction, no rub.   RESPIRATION: Good air entry bilaterally. Clear to auscultation.  ABDOMEN: Soft, nontender. Bowel sounds normoactive.   NEUROLOGIC: Alert, oriented x3. Cranial nerves 2-12 intact.   SKIN: Warm, dry, normal skin color, normal skin turgor.   PSYCHIATRIC: Normal mood, normal affect.   BACK: No CVA tenderness.   EXTREMITIES: No edema, no cyanosis. Normal range of motion.    DIAGNOSTIC TESTS: Acetaminophen less than 2, alcohol level 120.   WBC 9.64, hemoglobin 16.3, hematocrit 45.6, platelets 296. BUN 9,   creatinine 0.98, sodium 140, potassium 3.9, chloride 108, carbon   dioxide 25, glucose 118. ALT 22, AST 20, alkaline phosphatase 73.   CPK 259. Salicylate level 2. Troponin negative. Urine yellow, clear,   specific gravity 1.030, trace protein, negative glucose, negative nitrites,   negative leukocyte esterase. Urine drug screen is positive for cocaine   and cannabinoids.    ASSESSMENT    1.  Depression with anxiety.   2.  Tobacco abuse.   3.  Polysubstance abuse, including marijuana and cocaine.   4.  Alcohol binge history.    PLAN: Admission orders as per attending physician. The patient is   currently on Ativan p.r.n. He is on psychiatric medications including   Cogentin, Geodon, and Zyprexa. The patient is also on nicotine patch.   I agree with Tylenol p.r.n. Extensive patient education including   smoking cessation and counseling was done by bedside. Further   Pt education including polysubstance abuse and alcohol use was also   done by bedside. I encouraged the patient to drink adequate fluid   including water. We will obtain basic labs including CBC, CMP, and   TSH, as per protocol. Monitor vital signs, including blood pressure as   per unit protocol. Fall precautions, skin care precautions. Activity as   tolerated as per unit protocol. DVT prophylaxis as per attending   physician. I discussed advance directive with the patient. The father is   the next of kin. CODE IS FULL CODE. Further management will   depend on the patient's clinical progression, further evaluation by   attending physician, the results of tests, and recommendations by   consulting hospitalist. Had extensive discussion with the nursing staff.   They will call hospitalist team after obtaining the results of above labs.     Thank you for involving the hospitalist team in taking care of this very   nice patient.        Rutherford LimerickSAMUEL G. Daneisha Surges, MD      SU / SB   D:  12/06/2015   03:10   T:  12/06/2015   10:00   Job #:  [id number]

## 2015-12-06 NOTE — Behavioral Health Treatment Team (Signed)
GRATITUDE GROUP THERAPY PROGRESS NOTE    The patient [name] MedinaRyan Erdmann a 25 y.o. male is participating in Gratitude Group.    Group time: 15 minutes    Personal goal for participation: Appreciation of all things big and small    Goal orientation: Gratitude exercises    Group therapy participation: active    Therapeutic interventions reviewed and discussed:  Yes    Impression of participation: Active participant.    Orlena Sheldonzra D Suber  12/06/2015  10:58 AM

## 2015-12-06 NOTE — Behavioral Health Treatment Team (Signed)
Pt [facility].    No items to hospital safe.    In 7ICU belongings closet:  1 pr tennis shoes; 1 pullover jacket with strings (pt no want strings cut), 1 pr pajama bottoms with string (pt no want string cut), 1 t-shirt.    [facility].  Pt changed into hospital gowns.  Wearing one pair of boxer shorts.

## 2015-12-06 NOTE — Other (Signed)
Dr. Forrestine HimEnoila advised of need for H & P via telephone.

## 2015-12-06 NOTE — Progress Notes (Signed)
Problem: Depressed Mood (Adult/Pediatric)  Goal: *STG: Remains safe in hospital  Outcome: Progressing Towards Goal  Pt admitted to [facility] under [place] TDO.  Placed on q15 min checks for safety.  Will continue to monitor and assess pt.

## 2015-12-06 NOTE — Progress Notes (Addendum)
Problem: Falls - Risk of Goal met by 12/10/15  Goal: *Absence of Falls  Document Schmid Fall Risk and appropriate interventions in the flowsheet.   Fall Risk Interventions:   Pt ambulates with a steady gait.          Medication Interventions: Teach patient to arise slowly                  Problem: Depressed Mood (Adult/Pediatric) Goal met by 12/10/15  Goal: *STG: Participates in treatment plan  Outcome: Progressing Towards Goal  Pt participated in treatment team. Pt states he doe snot want to go back home.  Pt denies AVH hallucinations.    Goal: *STG: Remains safe in hospital  Outcome: Progressing Towards Goal  Mood is anxious. Pt denies SI.

## 2015-12-06 NOTE — Progress Notes (Signed)
[id number]- PRN Medication Documentation    Specific patient behavior that led to need for PRN medication: pt alert oriented, restless, c/o increased anxiety following treatment team meeting  Staff interventions attempted prior to PRN being given: education, coping skill, deep breathing, therapeutic communication   PRN medication given: po 1 mg ativan  Patient response/effectiveness of PRN medication: pt calm visible on unit

## 2015-12-06 NOTE — Other (Addendum)
Behavioral Health Interdisciplinary Rounds     Patient Name: [name] MedinaRyan Richardson  Age: 25 y.o.  Room/Bed:  732/01  Primary Diagnosis: MDD (major depressive disorder)   Admission Status: TDO     Readmission within 30 days: no  Power of Attorney in place: no  Patient requires a blocked bed: no          Reason for blocked bed:     VTE Prophylaxis: Not indicated  Flu vaccine given : no   Mobility needs/Fall risk: no    Nutritional Plan: no  Consults:          Labs/Testing due today?: no    Sleep hours: 3.0       Participation in Care/Groups:  No - New Admission  Medication Compliant?: New admission - No scheduled meds  PRNS (last 24 hours): None    Restraints (last 24 hours):  no  Substance Abuse:  yes  CIWA (range last 24 hours):  COWS (range last 24 hours):   Alcohol screening (AUDIT) completed -     If applicable, date SBIRT discussed in treatment team AND documented:   Tobacco - patient is a smoker: yes   Date tobacco education completed by RN: 12/06/15  24 hour chart check complete: no - New Admission    Patient goal(s) for today:   Treatment team focus/goals:   Progress note     LOS:  0  Expected LOS:     Financial concerns/prescription coverage:    Date of last family contact:       Family requesting physician contact today:    Discharge plan:   Guns in the home:         Outpatient provider(s):     Participating treatment team members: [name] MedinaRyan Richardson, * (assigned SW),

## 2015-12-06 NOTE — Behavioral Health Treatment Team (Addendum)
[facility]  Master Treatment Plan [name] Richardson  ????  ????  ????  Date Treatment Plan Initiated:??12/06/2015  ????  ????  Treatment Plan Modalities:  ????  Type of Modality Amount  (x minutes) Frequency (x/week) Duration (x days) Name of Responsible Staff   Community & wrap-up meetings to encourage peer interactions ????  15 ????  7 ????  1 ????  [name],[name]   Group psychotherapy to assist in building coping skills and internal controls ????  60 ????  7 ????  1 ????  [name]   Therapeutic activity groups to build coping skills ????  60 ????  7 ????  1 ????  [name]   Psychoeducation in group setting to address:   Medication education ????  15 ????  7 ????  1 ????  Corrie DandyMary, ??RN   Coping skills ????  20 ????  7 ????  1 ????  [name], [name]   Relaxation techniques ???? ???? ???? ????   Symptom management ???? ???? ???? ????   Discharge planning ????  15 ????  7 ????  1 ????  [name], [name]   Spirituality  ????  60 ????  7 ????  1 ????  [name]   NAMI ????  60 ????  7 ????  1 ????  [name]   Recovery/AA/NA ????  60 ????  7 ????  1 ????  [name]   Physician medication management ????  15 ????  7 ????  1 ????  [name]   Family meeting/discharge planning ???? ???? ???? ????   ???? ???? ???? ???? ????   ????  ????  ????  ????

## 2015-12-06 NOTE — H&P (Signed)
INITIAL PSYCHIATRIC EVALUATION            IDENTIFICATION:    Patient Name  [name] Richardson   Date of Birth 11/07/1990   CSN [id number]   Medical Record Number  [id number]      Age  [age] y.o.   PCP None   Admit date:  12/06/2015    Room Number  728/02  @ [facility]   Date of Service  12/06/2015            HISTORY         REASON FOR HOSPITALIZATION:  CC: "". Pt admitted under a temporary detention order (TDO)  for severe depression with suicidal ideations proving to be an imminent danger to self and others and an inability to care for self.    HISTORY OF PRESENT ILLNESS:    The patient, [name] Richardson, is a 25 y.o.  BLACK OR [place] male with a past psychiatric history significant for depression and agitation , who presents at this time with complaints of (and/or evidence of) the following emotional symptoms: depression, agitation and psychotic behavior.  Additional symptomatology include alcohol abuse, anger outbursts, feeling depressed, poor concentration, problem with medication and relationship difficulties and homicidal ideation .  The above symptoms have been present for years . These symptoms are of moderate  severity. These symptoms are constant  in nature.  The patient's condition has been precipitated by problems with his father and psychosocial stressors (problems with his family  ).  Patient's condition made worse by continued illicit drug use and alcohol use as well as treatment noncompliance. UDS: positive ; BAL=0.      ALLERGIES: No Known Allergies   MEDICATIONS PRIOR TO ADMISSION:   Prescriptions Prior to Admission   Medication Sig   ??? citalopram (CELEXA) 40 mg tablet Take 40 mg by mouth daily. Indications: ANXIETY WITH DEPRESSION   ??? hydrOXYzine pamoate (VISTARIL) 25 mg capsule Take 25 mg by mouth every eight (8) hours as needed for Anxiety. Indications: anxiety      PAST MEDICAL HISTORY:   Past Medical History:   Diagnosis Date   ??? Anxiety disorder    ??? Depression     History reviewed. No pertinent surgical history.   SOCIAL HISTORY: single and using drugs    Social History     Social History   ??? Marital status: SINGLE     Spouse name: N/A   ??? Number of children: N/A   ??? Years of education: N/A     Occupational History   ??? Not on file.     Social History Main Topics   ??? Smoking status: Current Every Day Smoker     Packs/day: 0.25     Years: 11.00   ??? Smokeless tobacco: Never Used   ??? Alcohol use 1.2 - 3.0 oz/week     2 - 5 Cans of beer per week      Comment: 2 times a week   ??? Drug use: Yes     Special: Marijuana, Cocaine   ??? Sexual activity: Not on file     Other Topics Concern   ??? Not on file     Social History Narrative   ??? No narrative on file      FAMILY HISTORY: History reviewed. No pertinent family history.   History reviewed. No pertinent family history.    REVIEW OF SYSTEMS:   History obtained from the patient  Pertinent items are noted in the History of  Present Illness.  All other Systems reviewed and are considered negative.           MENTAL STATUS EXAM & VITALS     MENTAL STATUS EXAM (MSE):    MSE FINDINGS ARE WITHIN NORMAL LIMITS (WNL) UNLESS OTHERWISE STATED BELOW. ( ALL OF THE BELOW CATEGORIES OF THE MSE HAVE BEEN REVIEWED (reviewed 12/06/2015) AND UPDATED AS DEEMED APPROPRIATE )  General Presentation age appropriate, cooperative   Orientation oriented to time, place and person   Vital Signs  See below (reviewed 12/06/2015); Vital Signs (BP, Pulse, & Temp) are within normal limits if not listed below.   Gait and Station Stable/steady, no ataxia   Musculoskeletal System No extrapyramidal symptoms (EPS); no abnormal muscular movements or Tardive Dyskinesia (TD); muscle strength and tone are within normal limits   Language No aphasia or dysarthria   Speech:  normal pitch and normal volume   Thought Processes logical; normal rate of thoughts; poor abstract reasoning/computation   Thought Associations tangential   Thought Content free of delusions    Suicidal Ideations no intention   Homicidal Ideations no plan    Mood:  irritable   Affect:  anxious   Memory recent  fair   Memory remote:  fair   Concentration/Attention:  distractable   Fund of Knowledge average   Insight:  limited   Reliability fair   Judgment:  limited          VITALS:     Patient Vitals for the past 24 hrs:   Temp Pulse Resp BP SpO2   12/06/15 1542 97.9 ??F (36.6 ??C) 60 16 123/90 100 %   12/06/15 1221 98.2 ??F (36.8 ??C) 91 18 112/86 -   12/06/15 0800 98.2 ??F (36.8 ??C) 70 18 122/89 100 %   12/06/15 0135 98.5 ??F (36.9 ??C) 69 18 112/84 100 %     Wt Readings from Last 3 Encounters:   12/06/15 68.7 kg (151 lb 8 oz)     Temp Readings from Last 3 Encounters:   12/06/15 97.9 ??F (36.6 ??C)     BP Readings from Last 3 Encounters:   12/06/15 123/90     Pulse Readings from Last 3 Encounters:   12/06/15 60            DATA     LABORATORY DATA:  Labs Reviewed - No data to display  No results found for any previous visit.     RADIOLOGY REPORTS:  No results found for this or any previous visit.No results found.           MEDICATIONS       ALL MEDICATIONS  Current Facility-Administered Medications   Medication Dose Route Frequency   ??? ziprasidone (GEODON) 20 mg in sterile water (preservative free) 1 mL injection  20 mg IntraMUSCular BID PRN   ??? OLANZapine (ZyPREXA) tablet 5 mg  5 mg Oral Q6H PRN   ??? benztropine (COGENTIN) tablet 2 mg  2 mg Oral BID PRN   ??? benztropine (COGENTIN) injection 2 mg  2 mg IntraMUSCular BID PRN   ??? LORazepam (ATIVAN) injection 2 mg  2 mg IntraMUSCular Q4H PRN   ??? LORazepam (ATIVAN) tablet 1 mg  1 mg Oral Q4H PRN   ??? zolpidem (AMBIEN) tablet 10 mg  10 mg Oral QHS PRN   ??? acetaminophen (TYLENOL) tablet 650 mg  650 mg Oral Q4H PRN   ??? ibuprofen (MOTRIN) tablet 400 mg  400 mg Oral Q8H PRN   ??? magnesium  hydroxide (MILK OF MAGNESIA) 400 mg/5 mL oral suspension 30 mL  30 mL Oral DAILY PRN   ??? nicotine (NICODERM CQ) 21 mg/24 hr patch 1 Patch  1 Patch TransDERmal DAILY PRN    ??? influenza vaccine 2017-18 (3 yrs+)(PF) (FLUZONE QUAD/FLUARIX QUAD) injection 0.5 mL  0.5 mL IntraMUSCular PRIOR TO DISCHARGE      SCHEDULED MEDICATIONS  Current Facility-Administered Medications   Medication Dose Route Frequency   ??? influenza vaccine 2017-18 (3 yrs+)(PF) (FLUZONE QUAD/FLUARIX QUAD) injection 0.5 mL  0.5 mL IntraMUSCular PRIOR TO DISCHARGE                ASSESSMENT & PLAN        The patient, [name] Richardson, is a 25 y.o.  male who presents at this time for treatment of the following diagnoses:  Patient Active Hospital Problem List:   MDD (major depressive disorder) (12/06/2015)    Assessment: sadness and depression     Plan: my need antidepressant   Cocaine and Alcohol abuse           I will continue to monitor blood levels (Depakote, Tegretol, lithium, clozapine---a drug with a narrow therapeutic index= NTI) and associated labs for drug therapy implemented that require intense monitoring for toxicity as deemed appropriate based on current medication side effects and pharmacodynamically determined drug 1/2 lives.         A coordinated, multidisplinary treatment team (includes the nurse, unit pharmcist, Administrator) round was conducted for this initial evaluation with the patient present.     The following regarding medications was addressed during rounds with patient:   the risks and benefits of the proposed medication. The patient was given the opportunity to ask questions. Informed consent given to the use of the above medications.     I will continue to adjust psychiatric and non-psychiatric medications (see above "medication" section and orders section for details) as deemed appropriate & based upon diagnoses and response to treatment.     I have reviewed admission (and previous/old) labs and medical tests in the EHR and or transferring hospital documents. I will continue to order blood tests/labs and diagnostic tests as deemed appropriate and review results  as they become available (see orders for details).    I have reviewed old psychiatric and medical records available in the EHR. I Will order additional psychiatric records from other institutions to further elucidate the nature of patient's psychopathology and review once available.    I will gather additional collateral information from friends, family and o/p treatment team to further elucidate the nature of patient's psychopathology and baselline level of psychiatric functioning.      ESTIMATED LENGTH OF STAY:    3       STRENGTHS:  Exercising self-direction/Resourceful, Access to housing/residential stability and Interpersonal/supportive relationships (family, friends, peers)                                        SIGNED:    Deerfield Rochester, MD  12/06/2015

## 2015-12-06 NOTE — Progress Notes (Signed)
TRANSFER - IN REPORT:    Verbal report [facility] from [facility] on Alease MedinaRyan Fusselman  being [facility] for routine progression of care      Report consisted of patient???s Situation, Background, Assessment and   Recommendations(SBAR).     Information from the following report(s) SBAR and ED Summary was reviewed with the receiving nurse.    Opportunity for questions and clarification was provided.      Assessment completed upon patient???s arrival to unit and care assumed.     Report called at 0036 (before Daylight Savings time).  Reported that TDO had been served to patient and pt to be transported by [employer].  Pt to be admitted to Rm 732-01.

## 2015-12-06 NOTE — Progress Notes (Addendum)
Problem: Depressed Mood (Adult/Pediatric)  Goal: *STG: Remains safe in hospital  Outcome: Progressing Towards Goal  Patient is sitting quietly in his room; reading.  Calm and cooperative.  No distress noted.  Patient remains safe in hospital.    1633 - PRN Medication Documentation    Specific patient behavior that led to need for PRN medication: Patient was involved in a verbal altercation  Staff interventions attempted prior to PRN being given: Talked to patient.  Patient requested antianxiety medications  PRN medication given: Ativan 1 mg PO  Patient response/effectiveness of PRN medication: 1615 - Patient is sitting quietly in the day room.

## 2015-12-07 LAB — LIPID PANEL
CHOL/HDL Ratio: 1.8 (ref 0–5.0)
Cholesterol, total: 136 MG/DL (ref <200)
HDL Cholesterol: 75 MG/DL
LDL, calculated: 45.8 MG/DL (ref 0–100)
Triglyceride: 76 MG/DL (ref <150)
VLDL, calculated: 15.2 MG/DL

## 2015-12-07 LAB — GLUCOSE, FASTING: Glucose: 75 MG/DL (ref 65–100)

## 2015-12-07 LAB — TSH 3RD GENERATION: TSH: 0.88 u[IU]/mL (ref 0.36–3.74)

## 2015-12-07 MED ORDER — CITALOPRAM 20 MG TAB
20 mg | Freq: Every evening | ORAL | Status: DC
Start: 2015-12-07 — End: 2015-12-09
  Administered 2015-12-07 – 2015-12-08 (×2): via ORAL

## 2015-12-07 MED ORDER — HYDROXYZINE 25 MG TAB
25 mg | Freq: Four times a day (QID) | ORAL | Status: DC | PRN
Start: 2015-12-07 — End: 2015-12-08

## 2015-12-07 MED FILL — CITALOPRAM 20 MG TAB: 20 mg | ORAL | Qty: 1

## 2015-12-07 MED FILL — LORAZEPAM 1 MG TAB: 1 mg | ORAL | Qty: 1

## 2015-12-07 MED FILL — OLANZAPINE 5 MG TAB: 5 mg | ORAL | Qty: 1

## 2015-12-07 MED FILL — NICOTINE 21 MG/24 HR DAILY PATCH: 21 mg/24 hr | TRANSDERMAL | Qty: 1

## 2015-12-07 NOTE — Other (Addendum)
Behavioral Health Interdisciplinary Rounds     Patient Name: [name] MedinaRyan Richardson  Age: 25 y.o.  Room/Bed:  728/02  Primary Diagnosis: MDD (major depressive disorder)   Admission Status: Voluntary Commitment     Readmission within 30 days: no  Power of Attorney in place: no  Patient requires a blocked bed: no          Reason for blocked bed:     VTE Prophylaxis: No  Flu vaccine given : no   Mobility needs/Fall risk: no    Nutritional Plan: no  Consults:          Labs/Testing due today?: yes    Sleep hours: 11.0       Participation in Care/Groups:  yes  Medication Compliant?: Yes  PRNS (last 24 hours) Anti anxiety   Restraints (last 24 hours):  no  Substance Abuse:  yes  CIWA (range last 24 hours):  COWS (range last 24 hours):   Alcohol screening (AUDIT) completed -  AUDIT Score: 6  If applicable, date SBIRT discussed in treatment team AND documented:          Tobacco - patient is a smoker:    Date tobacco education completed by RN:   24 hour chart check complete: yes     Patient goal(s) for today:   Treatment team focus/goals: Plan to titrate his medications.   Progress note He was paranoid guarded and delusional  in treatment team.     LOS:  1  Expected LOS: TBD     Financial concerns/prescription coverage:    Date of last family contact:       Family requesting physician contact today:  no  Discharge plan: he will return home when ready for discharge.   Guns in the home:   No       Outpatient provider(s):      Participating treatment team members: [name] MedinaRyan Richardson,  Inetta Fermoina Bishop,SW - Dr. Wallace CullensGray - Nicholes Rougharolyn Farmer, RN

## 2015-12-07 NOTE — Behavioral Health Treatment Team (Signed)
Did not attend Community Group.

## 2015-12-07 NOTE — Progress Notes (Addendum)
Problem: Depressed Mood (Adult/Pediatric) 12/10/2015  Goal: *STG: Participates in treatment plan  Outcome: Progressing Towards Goal  Was active participant in treatment team.Stated "I feel depressed.My families live in a cesspool pool I  don`t trust nobody.I don`t like [place] places.'Appears paranoid and thoughts appear disorganized.  Goal: *STG: Attends activities and groups  Outcome: Not Progressing Towards Goal  Has been isolative and with minimal interaction with staff or peers.  Goal: *STG: Remains safe in hospital  Outcome: Progressing Towards Goal  Pt denies any suicidal or homicidal thoughts. Contracts for safety. Remains on q 15 min safety checks.       COURT ORDERED VOLUNTARY COMMITAL.                                                                                                                                                                                                            [facility]  Master Treatment Plan Alease Medinayan Lip[place]        Date Treatment Plan Initiated: 12/07/2015    Treatment Plan Modalities:    Type of Modality Amount  (x minutes) Frequency (x/week) Duration (x days) Name of Responsible Staff   Community & wrap-up meetings to encourage peer interactions ??  15 ??  7 ??  1 ??   Howell PringleEzra Suber, [name]   Group psychotherapy to assist in building coping skills and internal controls ??  60 ??  7 ??  1 ??  [name]   Therapeutic activity groups to build coping skills ??  60 ??  7 ??  1 ??  [name]   Psychoeducation in group setting to address:   Medication education ??  15 ??  7 ??  1 ??  C Farmer-hall,  RN   Coping skills ??  20 ??  7 ??  1 ??  C Farmer-Hall, RN   Relaxation techniques ?? ?? ?? ??   Symptom management ?? ?? ?? ??   Discharge planning ??  15 ??  7 ??  1 ??   T Bishop Social Worker   Spirituality  ??  60 ??  7 ??  1 ??  Chaplain Nadine CountsBob   NAMI ??  60 ??  7 ??  1 ??  [name]   Recovery/AA/NA ??  60 ??  7 ??  1 ??  [name]   Physician medication management ??  15 ??   7 ??  1 ??  Dr.Gray   Family meeting/discharge planning ?? ?? ?? ??   ?? ?? ?? ?? ??

## 2015-12-07 NOTE — Behavioral Health Treatment Team (Addendum)
PSYCHIATRIC PROGRESS NOTE         Patient Name  [name] Richardson   Date of Birth 09/01/1990   CSN [id number]   Medical Record Number  [id number]      Age  25 y.o.   PCP None   Admit date:  12/06/2015    Room Number  728/02  @ [facility]   Date of Service  12/07/2015          PSYCHOTHERAPY SESSION NOTE:  Length of psychotherapy session: 20 minutes    Main condition/diagnosis/issues treated during session today, 12/07/2015 : anger, anxiety and paranoia    I employed Cognitive Behavioral therapy techniques, Reality-Oriented psychotherapy, as well as supportive psychotherapy in regards to various ongoing psychosocial stressors, including the following: pre-admission and current problems; housing issues; occupational issues; academic issues; legal issues; medical issues; and stress of hospitalization. Interpersonal relationship issues and psychodynamic conflicts explored.  Attempts made to alleviate maladaptive patterns. We, also, worked on issues of denial & effects of substance dependency/use     Overall, patient is not progressing    Treatment Plan Update (reviewed an updated 12/07/2015) :  I will modify psychotherapy tx plan by implementing more stress management strategies, building upon cognitive behavioral techniques, increasing coping skills, as well as shoring up psychological defenses).    An extended energy and skill set was needed to engage pt in psychotherapy due to some of the following: resistiveness, complexity, negativity, confrontational nature, hostile behaviors, and/or severe abnormalities in thought processes/psychosis resulting in the loss of expressive/receptive language communication skills.                E & M PROGRESS NOTE:         HISTORY       CC:  "I need my medicines"  HISTORY OF PRESENT ILLNESS/INTERVAL HISTORY:  (reviewed/updated 12/07/2015).  per initial evaluation:   The patient, [name] Richardson, is a 25 y.o.  BLACK OR [place] male  with a past psychiatric history significant for depression and agitation , who presents at this time with complaints of (and/or evidence of) the following emotional symptoms: depression, agitation and psychotic behavior.  Additional symptomatology include alcohol abuse, anger outbursts, feeling depressed, poor concentration, problem with medication and relationship difficulties and homicidal ideation .  The above symptoms have been present for years . These symptoms are of moderate  severity. These symptoms are constant  in nature.  The patient's condition has been precipitated by problems with his father and psychosocial stressors (problems with his family  ).  Patient's condition made worse by continued illicit drug use and alcohol use as well as treatment noncompliance. UDS: positive ; BAL=0.     [name] Richardson presents/reports/evidences the following emotional symptoms today, 12/07/2015:depression, agitation, paranoid behavior and anxiety. The above symptoms have been present for several days. These symptoms are of moderate to high  severity. The symptoms are constant  in nature. Additional symptomatology and features include agitation.       SIDE EFFECTS: (reviewed/updated 12/07/2015)  None reported or admitted to.     ALLERGIES:(reviewed/updated 12/07/2015)  No Known Allergies   MEDICATIONS PRIOR TO ADMISSION:(reviewed/updated 12/07/2015)  Prescriptions Prior to Admission   Medication Sig   ??? citalopram (CELEXA) 40 mg tablet Take 40 mg by mouth daily. Indications: ANXIETY WITH DEPRESSION   ??? hydrOXYzine pamoate (VISTARIL) 25 mg capsule Take 25 mg by mouth every eight (8) hours as needed for Anxiety. Indications: anxiety      PAST MEDICAL HISTORY: Past  medical history from the initial psychiatric evaluation has been reviewed (reviewed/updated 12/07/2015) with no additional updates (I asked patient and no additional past medical history provided). Past Medical History:   Diagnosis Date   ??? Anxiety disorder     ??? Depression    History reviewed. No pertinent surgical history.   SOCIAL HISTORY: Social history from the initial psychiatric evaluation has been reviewed (reviewed/updated 12/07/2015) with no additional updates (I asked patient and no additional social history provided). Social History     Social History   ??? Marital status: SINGLE     Spouse name: N/A   ??? Number of children: N/A   ??? Years of education: N/A     Occupational History   ??? Not on file.     Social History Main Topics   ??? Smoking status: Current Every Day Smoker     Packs/day: 0.25     Years: 11.00   ??? Smokeless tobacco: Never Used   ??? Alcohol use 1.2 - 3.0 oz/week     2 - 5 Cans of beer per week      Comment: 2 times a week   ??? Drug use: Yes     Special: Marijuana, Cocaine   ??? Sexual activity: Not on file     Other Topics Concern   ??? Not on file     Social History Narrative   ??? No narrative on file      FAMILY HISTORY: Family history from the initial psychiatric evaluation has been reviewed (reviewed/updated 12/07/2015) with no additional updates (I asked patient and no additional family history provided). History reviewed. No pertinent family history.    REVIEW OF SYSTEMS: (reviewed/updated 12/07/2015)  Appetite:good   Sleep: good   All other Review of Systems: Negative except severe movement disorder affecting neck; +_depression and anxiety         MENTAL STATUS EXAM & VITALS     MENTAL STATUS EXAM (MSE):    MSE FINDINGS ARE WITHIN NORMAL LIMITS (WNL) UNLESS OTHERWISE STATED BELOW. ( ALL OF THE BELOW CATEGORIES OF THE MSE HAVE BEEN REVIEWED (reviewed 12/07/2015) AND UPDATED AS DEEMED APPROPRIATE )  General Presentation age appropriate and casually dressed, cooperative   Orientation oriented to time, place and person   Vital Signs  See below (reviewed 12/07/2015); Vital Signs (BP, Pulse, & Temp) are within normal limits if not listed below.   Gait and Station Stable/steady, no ataxia   Musculoskeletal System No extrapyramidal symptoms (EPS); no abnormal  muscular movements or Tardive Dyskinesia (TD); muscle strength and tone are within normal limits   Language No aphasia or dysarthria   Speech:  hypoverbal   Thought Processes Mostly logical; normal rate of thoughts; good abstract reasoning/computation   Thought Associations goal directed   Thought Content free of delusions   Suicidal Ideations none   Homicidal Ideations none   Mood:  anxious  and depressed   Affect:  inappropriate and mood-incongruent   Memory recent  impaired   Memory remote:  impaired   Concentration/Attention:  distractable   Fund of Knowledge below avg.   Insight:  limited   Reliability poor   Judgment:  limited          VITALS:     Patient Vitals for the past 24 hrs:   Temp Pulse Resp BP SpO2   12/07/15 0824 97.5 ??F (36.4 ??C) 71 18 115/80 100 %   12/06/15 1542 97.9 ??F (36.6 ??C) 60 16 123/90 100 %   12/06/15  1221 98.2 ??F (36.8 ??C) 91 18 112/86 -     Wt Readings from Last 3 Encounters:   12/06/15 68.7 kg (151 lb 8 oz)     Temp Readings from Last 3 Encounters:   12/07/15 97.5 ??F (36.4 ??C)     BP Readings from Last 3 Encounters:   12/07/15 115/80     Pulse Readings from Last 3 Encounters:   12/07/15 71            DATA     LABORATORY DATA:(reviewed/updated 12/07/2015)  Recent Results (from the past 24 hour(s))   TSH 3RD GENERATION    Collection Time: 12/07/15  5:28 AM   Result Value Ref Range    TSH 0.88 0.36 - 3.74 uIU/mL   GLUCOSE, FASTING    Collection Time: 12/07/15  5:28 AM   Result Value Ref Range    Glucose 75 65 - 100 MG/DL   LIPID PANEL    Collection Time: 12/07/15  5:28 AM   Result Value Ref Range    LIPID PROFILE          Cholesterol, total 136 <200 MG/DL    Triglyceride 76 <981 MG/DL    HDL Cholesterol 75 MG/DL    LDL, calculated 19.1 0 - 100 MG/DL    VLDL, calculated 47.8 MG/DL    CHOL/HDL Ratio 1.8 0 - 5.0       No results found for: VALF2, VALAC, VALP, VALPR, DS6, CRBAM, CRBAMP, CARB2, XCRBAM  No results found for: LITHM   RADIOLOGY REPORTS:(reviewed/updated 12/07/2015)   No results found.       MEDICATIONS     ALL MEDICATIONS:   Current Facility-Administered Medications   Medication Dose Route Frequency   ??? ziprasidone (GEODON) 20 mg in sterile water (preservative free) 1 mL injection  20 mg IntraMUSCular BID PRN   ??? OLANZapine (ZyPREXA) tablet 5 mg  5 mg Oral Q6H PRN   ??? benztropine (COGENTIN) tablet 2 mg  2 mg Oral BID PRN   ??? benztropine (COGENTIN) injection 2 mg  2 mg IntraMUSCular BID PRN   ??? LORazepam (ATIVAN) injection 2 mg  2 mg IntraMUSCular Q4H PRN   ??? LORazepam (ATIVAN) tablet 1 mg  1 mg Oral Q4H PRN   ??? zolpidem (AMBIEN) tablet 10 mg  10 mg Oral QHS PRN   ??? acetaminophen (TYLENOL) tablet 650 mg  650 mg Oral Q4H PRN   ??? ibuprofen (MOTRIN) tablet 400 mg  400 mg Oral Q8H PRN   ??? magnesium hydroxide (MILK OF MAGNESIA) 400 mg/5 mL oral suspension 30 mL  30 mL Oral DAILY PRN   ??? nicotine (NICODERM CQ) 21 mg/24 hr patch 1 Patch  1 Patch TransDERmal DAILY PRN   ??? influenza vaccine 2017-18 (3 yrs+)(PF) (FLUZONE QUAD/FLUARIX QUAD) injection 0.5 mL  0.5 mL IntraMUSCular PRIOR TO DISCHARGE      SCHEDULED MEDICATIONS:   Current Facility-Administered Medications   Medication Dose Route Frequency   ??? influenza vaccine 2017-18 (3 yrs+)(PF) (FLUZONE QUAD/FLUARIX QUAD) injection 0.5 mL  0.5 mL IntraMUSCular PRIOR TO DISCHARGE          ASSESSMENT & PLAN     DIAGNOSES REQUIRING ACTIVE TREATMENT AND MONITORING: (reviewed/updated 12/07/2015)  Patient Active Hospital Problem List:   MDD (major depressive disorder) (12/06/2015)    Assessment: mood improving, some odd behaviors.    Plan: Agree with continued inpatient hospitalization for further stabilization, safety monitoring and medication.   Need collateral information from family regarding recent behavior, symptoms, childhood  history, family history.  Resume celexa and hydroxyzine        In summary, [name] Richardson, is a 25 y.o.  male who presents with a severe exacerbation of the principal diagnosis of MDD (major depressive disorder)   Patient's condition is improving.  Patient requires continued inpatient hospitalization for further stabilization, safety monitoring and medication management.  I will continue to coordinate the provision of individual, milieu, occupational, group, and substance abuse therapies to address target symptoms/diagnoses as deemed appropriate for the individual patient.  A coordinated, multidisplinary treatment team round was conducted with the patient (this team consists of the nurse, psychiatric unit pharmcist, Administrator).     Complete current electronic health record for patient has been reviewed today including consultant notes, ancillary staff notes, nurses and psychiatric tech notes.    Suicide risk assessment completed and patient deemed to be of low risk for suicide at this time.     The following regarding medications was addressed during rounds with patient:   the risks and benefits of the proposed medication. The patient was given the opportunity to ask questions. Informed consent given to the use of the above medications. Will continue to adjust psychiatric and non-psychiatric medications (see above "medication" section and orders section for details) as deemed appropriate & based upon diagnoses and response to treatment.     I will continue to order blood tests/labs and diagnostic tests as deemed appropriate and review results as they become available (see orders for details and above listed lab/test results).    I will order psychiatric records from previous psych hospitals to further elucidate the nature of patient's psychopathology and review once available.    I will gather additional collateral information from friends, family and o/p treatment team to further elucidate the nature of patient's psychopathology and baselline level of psychiatric functioning.         I certify that this patient's inpatient psychiatric hospital services  furnished since the previous certification were, and continue to be, required for treatment that could reasonably be expected to improve the patient's condition, or for diagnostic study, and that the patient continues to need, on a daily basis, active treatment furnished directly by or requiring the supervision of inpatient psychiatric facility personnel. In addition, the hospital records show that services furnished were intensive treatment services, admission or related services, or equivalent services.    EXPECTED DISCHARGE DATE/DAY: TBD     DISPOSITION: Home       Signed By:   Vita Barley, MD  12/07/2015

## 2015-12-07 NOTE — Behavioral Health Treatment Team (Addendum)
[id number] -  AM Lab work obtained.    PRN Medication Documentation    Specific patient behavior that led to need for PRN medication: c/o increasing anxiety & paranoid ideations following meeting with Independent Evaluator.  Staff interventions attempted prior to PRN being given: education, coping skills and therapeutic communication.  PRN medication given: Ativan 1 mg po  & Zyprexa 5 mg po @ 0655.  Patient response/effectiveness of PRN medication: Pt returned to bedroom.

## 2015-12-07 NOTE — Behavioral Health Treatment Team (Signed)
[facility]

## 2015-12-07 NOTE — Behavioral Health Treatment Team (Signed)
GROUP THERAPY PROGRESS NOTE    Alease Medinayan Cuffe participated in the Acute Unit's afternoon Process Group, with a focus on identifying feelings, planning for the rest of the day, and preparing for discharge.     Group time: 45 minutes.     Personal goal for participation: To increase the capacity to improve one???s mood and structure.     Goal orientation: The patient will be able to identify their feelings, develop a plan for structuring their day, and discharge planning.     Group therapy participation: With prompting, this patient participated in the group.      Therapeutic interventions reviewed and discussed: The group members were asked to introduce themselves to each other and to see if they could identify an emotion they are having and/or let the group know what they want to focus on for the day as they continue to make discharge plans.     Impression of participation: The patient said he was "feeling great.Marland Kitchen.Marland Kitchen.I'd rather be here than around my family." He implied that he had been involved in a relationship with a woman who betrayed him, but he did not elaborate or share enough to make it clear what specifically he was talking about, "It's too complicated." He expressed no current SI/HI and displayed no overt psychotic symptoms in this group. He seemed to suggest that he has had a problem with his temper and aggression. He added [place]. The patient's affect was superficial and anxious. His mood reflected his affect. This was the patient's first process group with the undersigned.

## 2015-12-07 NOTE — Behavioral Health Treatment Team (Signed)
PSYCHOSOCIAL ASSESSMENT  :Patient identifying info:  [name] Richardson is a 25 y.o., male admitted 12/06/2015  2:00 AM     Presenting problem and precipitating factors: Patient was sent to [name] on a [facility] TDO due to homicidal ideations towards his fathers neighbor.  He has required IM prn medications due to his aggressive behavior.      Mental status assessment:alert oriented, mood labile and somewhat distracted     Current psychiatric providers and contact info: [facility]     Previous psychiatric services/providers and response to treatment: he was hospitalized last month at [facility] in [place].     Family history of mental illness :unknown    Substance abuse history:    Social History   Substance Use Topics   ??? Smoking status: Current Every Day Smoker     Packs/day: 0.25     Years: 11.00   ??? Smokeless tobacco: Never Used   ??? Alcohol use 1.2 - 3.0 oz/week     2 - 5 Cans of beer per week      Comment: 2 times a week       Family constellation: father     Is significant other involved?       Describe support system:     Describe living arrangements and home environment:states he lives alone   Health issues:   Hospital Problems  Date Reviewed: 12/06/2015          Codes Class Noted POA    * (Principal)MDD (major depressive disorder) ICD-10-CM: F32.9  ICD-9-CM: 296.20  12/06/2015 Yes              Trauma history: none noted     Legal issues: no    History of military service: no    Financial status: SSDI     Religious/cultural factors: none noted     Education/work history: 10 th grade education     Have you been licensed as a Designer, jewelleryheath care professional (current or expired): no  Leisure and recreation preferences: unknown  Describe coping skills:ineffectual     Carilyn GoodpastureKristina R Bishop  12/07/2015

## 2015-12-07 NOTE — Progress Notes (Signed)
Problem: Falls - Risk of  Goal: *Absence of Falls  Document Schmid Fall Risk and appropriate interventions in the flowsheet.   Outcome: Progressing Towards Goal  Fall Risk Interventions:  Patient has been fall free since arriving on the Acute Unit.  He is currently sleeping, no stress noted.            Medication Interventions: Teach patient to arise slowly

## 2015-12-07 NOTE — Progress Notes (Addendum)
Problem: Depressed Mood (Adult/Pediatric)  Goal: *STG: Remains safe in hospital  Outcome: Progressing Towards Goal  Pt out in milieu with peers. Seen actively responding to self at times as pt is seen laughing/smiling inappropriately. Appetite is good and pt has complied with scheduled medications. Staff will continue to monitor q 15 min checks.

## 2015-12-08 MED ORDER — HYDROXYZINE 25 MG TAB
25 mg | Freq: Four times a day (QID) | ORAL | Status: DC | PRN
Start: 2015-12-08 — End: 2015-12-09
  Administered 2015-12-08 – 2015-12-09 (×2): via ORAL

## 2015-12-08 MED ORDER — LORAZEPAM 1 MG TAB
1 mg | ORAL | Status: DC | PRN
Start: 2015-12-08 — End: 2015-12-09

## 2015-12-08 MED FILL — NICOTINE 21 MG/24 HR DAILY PATCH: 21 mg/24 hr | TRANSDERMAL | Qty: 1

## 2015-12-08 MED FILL — LORAZEPAM 2 MG/ML IJ SOLN: 2 mg/mL | INTRAMUSCULAR | Qty: 1

## 2015-12-08 MED FILL — HYDROXYZINE 25 MG TAB: 25 mg | ORAL | Qty: 1

## 2015-12-08 MED FILL — CITALOPRAM 20 MG TAB: 20 mg | ORAL | Qty: 1

## 2015-12-08 MED FILL — GEODON 20 MG/ML (FINAL CONCENTRATION) INTRAMUSCULAR SOLUTION: 20 mg/mL (final conc.) | INTRAMUSCULAR | Qty: 20

## 2015-12-08 MED FILL — LORAZEPAM 1 MG TAB: 1 mg | ORAL | Qty: 1

## 2015-12-08 NOTE — Progress Notes (Signed)
Problem: Falls - Risk of  Goal: *Absence of Falls  Document Schmid Fall Risk and appropriate interventions in the flowsheet.   Outcome: Progressing Towards Goal  Fall Risk Interventions:  Patient has been fall free since arriving on the Acute Unit.  He is currently sleeping, no stress noted            Medication Interventions: Assess postural VS orthostatic hypotension         History of Falls Interventions: Room close to nurse's station

## 2015-12-08 NOTE — Behavioral Health Treatment Team (Signed)
GROUP THERAPY PROGRESS NOTE    [name] MedinaRyan Richardson is participating in Reflections.     Group time: 30 minutes    Personal goal for participation: relaxation    Goal orientation: personal    Group therapy participation: active    Therapeutic interventions reviewed and discussed: Music and card games.     Impression of participation: Seems in fair spirits. Socializing well with peers.

## 2015-12-08 NOTE — Progress Notes (Signed)
Problem: Falls - Risk of  Goal: *Absence of Falls  Document Schmid Fall Risk and appropriate interventions in the flowsheet.   Fall Risk Interventions:  Non slip socks   Bed in low position             Medication Interventions: Teach patient to arise slowly         History of Falls Interventions: Room close to nurse's station        Problem: Depressed Mood (Adult/Pediatric)  Goal: *STG: Participates in treatment plan  Outcome: Progressing Towards Goal  Pt. Met in treatment team     With Dr. Artist Beach no voices  Some lability   Required prn medication today   Aggressive with  Peers   Goal: *STG: Complies with medication therapy  Outcome: Progressing Towards Goal  Medication compliant  Goal: Interventions  Will continue to monitor   On 15 min. Checks for safety    Assess thought process  Medication effectiveness   Encourage groups

## 2015-12-08 NOTE — Behavioral Health Treatment Team (Signed)
PSYCHIATRIC PROGRESS NOTE         Patient Name  [name] MedinaRyan Richardson   Date of Birth 10/15/1990   CSN [id number]   Medical Record Number  [id number]      Age  25 y.o.   PCP None   Admit date:  12/06/2015    Room Number  728/02  @ [facility]   Date of Service  12/08/2015          PSYCHOTHERAPY SESSION NOTE:  Length of psychotherapy session: 20 minutes    Main condition/diagnosis/issues treated during session today, 12/08/2015 : anger, anxiety and paranoia    I employed Cognitive Behavioral therapy techniques, Reality-Oriented psychotherapy, as well as supportive psychotherapy in regards to various ongoing psychosocial stressors, including the following: pre-admission and current problems; housing issues; occupational issues; academic issues; legal issues; medical issues; and stress of hospitalization. Interpersonal relationship issues and psychodynamic conflicts explored.  Attempts made to alleviate maladaptive patterns. We, also, worked on issues of denial & effects of substance dependency/use     Overall, patient is not progressing    Treatment Plan Update (reviewed an updated 12/08/2015) :  I will modify psychotherapy tx plan by implementing more stress management strategies, building upon cognitive behavioral techniques, increasing coping skills, as well as shoring up psychological defenses).    An extended energy and skill set was needed to engage pt in psychotherapy due to some of the following: resistiveness, complexity, negativity, confrontational nature, hostile behaviors, and/or severe abnormalities in thought processes/psychosis resulting in the loss of expressive/receptive language communication skills.                E & M PROGRESS NOTE:         HISTORY       CC:  "I need my medicines"  HISTORY OF PRESENT ILLNESS/INTERVAL HISTORY:  (reviewed/updated 12/08/2015).  per initial evaluation:   The patient, [name] Richardson, is a 25 y.o.  BLACK OR [place] male  with a past psychiatric history significant for depression and agitation , who presents at this time with complaints of (and/or evidence of) the following emotional symptoms: depression, agitation and psychotic behavior.  Additional symptomatology include alcohol abuse, anger outbursts, feeling depressed, poor concentration, problem with medication and relationship difficulties and homicidal ideation .  The above symptoms have been present for years . These symptoms are of moderate  severity. These symptoms are constant  in nature.  The patient's condition has been precipitated by problems with his father and psychosocial stressors (problems with his family  ).  Patient's condition made worse by continued illicit drug use and alcohol use as well as treatment noncompliance. UDS: positive ; BAL=0.     [name] Richardson presents/reports/evidences the following emotional symptoms today, 12/08/2015:depression, agitation, paranoid behavior and anxiety. The above symptoms have been present for several days. These symptoms are of moderate to high  severity. The symptoms are constant  in nature. Additional symptomatology and features include anxiety and depression. Mood improving.      SIDE EFFECTS: (reviewed/updated 12/08/2015)  None reported or admitted to.     ALLERGIES:(reviewed/updated 12/08/2015)  No Known Allergies   MEDICATIONS PRIOR TO ADMISSION:(reviewed/updated 12/08/2015)  Prescriptions Prior to Admission   Medication Sig   ??? citalopram (CELEXA) 40 mg tablet Take 40 mg by mouth daily. Indications: ANXIETY WITH DEPRESSION   ??? hydrOXYzine pamoate (VISTARIL) 25 mg capsule Take 25 mg by mouth every eight (8) hours as needed for Anxiety. Indications: anxiety      PAST  MEDICAL HISTORY: Past medical history from the initial psychiatric evaluation has been reviewed (reviewed/updated 12/08/2015) with no additional updates (I asked patient and no additional past medical history provided).   Past Medical History:   Diagnosis Date    ??? Anxiety disorder    ??? Depression    History reviewed. No pertinent surgical history.   SOCIAL HISTORY: Social history from the initial psychiatric evaluation has been reviewed (reviewed/updated 12/08/2015) with no additional updates (I asked patient and no additional social history provided).   Social History     Social History   ??? Marital status: SINGLE     Spouse name: N/A   ??? Number of children: N/A   ??? Years of education: N/A     Occupational History   ??? Not on file.     Social History Main Topics   ??? Smoking status: Current Every Day Smoker     Packs/day: 0.25     Years: 11.00   ??? Smokeless tobacco: Never Used   ??? Alcohol use 1.2 - 3.0 oz/week     2 - 5 Cans of beer per week      Comment: 2 times a week   ??? Drug use: Yes     Special: Marijuana, Cocaine   ??? Sexual activity: Not on file     Other Topics Concern   ??? Not on file     Social History Narrative   ??? No narrative on file      FAMILY HISTORY: Family history from the initial psychiatric evaluation has been reviewed (reviewed/updated 12/08/2015) with no additional updates (I asked patient and no additional family history provided). History reviewed. No pertinent family history.    REVIEW OF SYSTEMS: (reviewed/updated 12/08/2015)  Appetite:good   Sleep: good   All other Review of Systems: Negative except severe movement disorder affecting neck; +_depression and anxiety         MENTAL STATUS EXAM & VITALS     MENTAL STATUS EXAM (MSE):    MSE FINDINGS ARE WITHIN NORMAL LIMITS (WNL) UNLESS OTHERWISE STATED BELOW. ( ALL OF THE BELOW CATEGORIES OF THE MSE HAVE BEEN REVIEWED (reviewed 12/08/2015) AND UPDATED AS DEEMED APPROPRIATE )  General Presentation age appropriate and casually dressed, cooperative   Orientation oriented to time, place and person   Vital Signs  See below (reviewed 12/08/2015); Vital Signs (BP, Pulse, & Temp) are within normal limits if not listed below.   Gait and Station Stable/steady, no ataxia    Musculoskeletal System No extrapyramidal symptoms (EPS); no abnormal muscular movements or Tardive Dyskinesia (TD); muscle strength and tone are within normal limits   Language No aphasia or dysarthria   Speech:  hypoverbal   Thought Processes Mostly logical; normal rate of thoughts; good abstract reasoning/computation   Thought Associations goal directed   Thought Content free of delusions   Suicidal Ideations none   Homicidal Ideations none   Mood:  anxious  and depressed   Affect:  inappropriate and mood-incongruent   Memory recent  impaired   Memory remote:  impaired   Concentration/Attention:  distractable   Fund of Knowledge below avg.   Insight:  limited   Reliability poor   Judgment:  limited          VITALS:     Patient Vitals for the past 24 hrs:   Temp Pulse Resp BP SpO2   12/08/15 1130 98 ??F (36.7 ??C) 68 16 116/77 -   12/08/15 0800 97.4 ??F (36.3 ??C) 88 18 (!)  135/93 97 %   12/07/15 1538 97.5 ??F (36.4 ??C) 72 18 109/73 100 %     Wt Readings from Last 3 Encounters:   12/06/15 68.7 kg (151 lb 8 oz)     Temp Readings from Last 3 Encounters:   12/08/15 98 ??F (36.7 ??C)     BP Readings from Last 3 Encounters:   12/08/15 116/77     Pulse Readings from Last 3 Encounters:   12/08/15 68            DATA     LABORATORY DATA:(reviewed/updated 12/08/2015)  No results found for this or any previous visit (from the past 24 hour(s)).  No results found for: VALF2, VALAC, VALP, VALPR, DS6, CRBAM, CRBAMP, CARB2, XCRBAM  No results found for: LITHM   RADIOLOGY REPORTS:(reviewed/updated 12/08/2015)  No results found.       MEDICATIONS     ALL MEDICATIONS:   Current Facility-Administered Medications   Medication Dose Route Frequency   ??? hydrOXYzine HCl (ATARAX) tablet 25 mg  25 mg Oral Q6H PRN   ??? LORazepam (ATIVAN) tablet 1 mg  1 mg Oral Q4H PRN   ??? citalopram (CELEXA) tablet 20 mg  20 mg Oral QPM   ??? ziprasidone (GEODON) 20 mg in sterile water (preservative free) 1 mL injection  20 mg IntraMUSCular BID PRN    ??? OLANZapine (ZyPREXA) tablet 5 mg  5 mg Oral Q6H PRN   ??? benztropine (COGENTIN) tablet 2 mg  2 mg Oral BID PRN   ??? benztropine (COGENTIN) injection 2 mg  2 mg IntraMUSCular BID PRN   ??? LORazepam (ATIVAN) injection 2 mg  2 mg IntraMUSCular Q4H PRN   ??? zolpidem (AMBIEN) tablet 10 mg  10 mg Oral QHS PRN   ??? acetaminophen (TYLENOL) tablet 650 mg  650 mg Oral Q4H PRN   ??? ibuprofen (MOTRIN) tablet 400 mg  400 mg Oral Q8H PRN   ??? magnesium hydroxide (MILK OF MAGNESIA) 400 mg/5 mL oral suspension 30 mL  30 mL Oral DAILY PRN   ??? nicotine (NICODERM CQ) 21 mg/24 hr patch 1 Patch  1 Patch TransDERmal DAILY PRN   ??? influenza vaccine 2017-18 (3 yrs+)(PF) (FLUZONE QUAD/FLUARIX QUAD) injection 0.5 mL  0.5 mL IntraMUSCular PRIOR TO DISCHARGE      SCHEDULED MEDICATIONS:   Current Facility-Administered Medications   Medication Dose Route Frequency   ??? citalopram (CELEXA) tablet 20 mg  20 mg Oral QPM   ??? influenza vaccine 2017-18 (3 yrs+)(PF) (FLUZONE QUAD/FLUARIX QUAD) injection 0.5 mL  0.5 mL IntraMUSCular PRIOR TO DISCHARGE          ASSESSMENT & PLAN     DIAGNOSES REQUIRING ACTIVE TREATMENT AND MONITORING: (reviewed/updated 12/08/2015)  Patient Active Hospital Problem List:   MDD (major depressive disorder) (12/06/2015)    Assessment: mood improving, some odd behaviors.    Plan: Agree with continued inpatient hospitalization for further stabilization, safety monitoring and medication.   Need collateral information from family regarding recent behavior, symptoms, childhood history, family history.  Resume celexa and hydroxyzine  Plan for dc tomorrow        In summary, [name] Richardson, is a 25 y.o.  male who presents with a severe exacerbation of the principal diagnosis of MDD (major depressive disorder)  Patient's condition is improving.  Patient requires continued inpatient hospitalization for further stabilization, safety monitoring and medication management.  I will continue to coordinate the provision of  individual, milieu, occupational, group, and substance abuse therapies to address target  symptoms/diagnoses as deemed appropriate for the individual patient.  A coordinated, multidisplinary treatment team round was conducted with the patient (this team consists of the nurse, psychiatric unit pharmcist, Administrator).     Complete current electronic health record for patient has been reviewed today including consultant notes, ancillary staff notes, nurses and psychiatric tech notes.    Suicide risk assessment completed and patient deemed to be of low risk for suicide at this time.     The following regarding medications was addressed during rounds with patient:   the risks and benefits of the proposed medication. The patient was given the opportunity to ask questions. Informed consent given to the use of the above medications. Will continue to adjust psychiatric and non-psychiatric medications (see above "medication" section and orders section for details) as deemed appropriate & based upon diagnoses and response to treatment.     I will continue to order blood tests/labs and diagnostic tests as deemed appropriate and review results as they become available (see orders for details and above listed lab/test results).    I will order psychiatric records from previous psych hospitals to further elucidate the nature of patient's psychopathology and review once available.    I will gather additional collateral information from friends, family and o/p treatment team to further elucidate the nature of patient's psychopathology and baselline level of psychiatric functioning.         I certify that this patient's inpatient psychiatric hospital services furnished since the previous certification were, and continue to be, required for treatment that could reasonably be expected to improve the patient's condition, or for diagnostic study, and that the patient  continues to need, on a daily basis, active treatment furnished directly by or requiring the supervision of inpatient psychiatric facility personnel. In addition, the hospital records show that services furnished were intensive treatment services, admission or related services, or equivalent services.    EXPECTED DISCHARGE DATE/DAY: TBD     DISPOSITION: Home       Signed By:   Vita Barley, MD  12/08/2015

## 2015-12-08 NOTE — Behavioral Health Treatment Team (Signed)
GROUP THERAPY PROGRESS NOTE    [name] Richardson participated in the Acute Unit's afternoon Process Group, with a focus on identifying feelings, planning for the rest of the day, and preparing for discharge.     Group time: 40 minutes.     Personal goal for participation: To increase the capacity to improve one???s mood and structure.     Goal orientation: The patient will be able to identify their feelings, develop a plan for structuring their day, and discharge planning.     Group therapy participation: With prompting, this patient partially participated in the group.      Therapeutic interventions reviewed and discussed: The group members were asked to introduce themselves to each other and to see if they could identify an emotion they are having and/or let the group know what they want to focus on for the day as they continue to make discharge plans.     Impression of participation: The patient joined the group in the last ten minutes of the session and said he was feeling "not too bad." While he would answer briefly any question, he did not initiate any conversation. He sat mostly looking down at the floor. He did mention that he did not think he was ready for discharge and seemed to be confused about whether he was scheduled for discharge today or not. He expressed no current SI/HI and displayed no overt psychotic symptoms in this group, although he mentioned needing a PRN shot right before coming into group and he looked at times so self-absorbed that he may have been responding to internal stimuli. His affect angry but not agitated. His mood was irritable and his thinking did not appear completely clear or easy to follow.

## 2015-12-08 NOTE — Progress Notes (Signed)
Problem: Depressed Mood (Adult/Pediatric)  Goal: *STG: Remains safe in hospital  Outcome: Progressing Towards Goal  Pt resting quietly in bed. Respirations regular, even and unlabored. NAD. Q-15 checks for safety    -24 hr chart review completed

## 2015-12-08 NOTE — Progress Notes (Signed)
Problem: Depressed Mood (Adult/Pediatric)  Goal: *STG: Remains safe in hospital  Outcome: Progressing Towards Goal  Visible on the unit. Mood is anxious. Pt denies SI.  Compliant with meals and medications.  Continue to encourage and educate.

## 2015-12-08 NOTE — Other (Addendum)
Behavioral Health Interdisciplinary Rounds     Patient Name: Alease MedinaRyan Roets  Age: 25 y.o.  Room/Bed:  728/02  Primary Diagnosis: MDD (major depressive disorder)   Admission Status: Voluntary Commitment     Readmission within 30 days: no  Power of Attorney in place: no  Patient requires a blocked bed: no          Reason for blocked bed:     VTE Prophylaxis: No  Flu vaccine given : no   Mobility needs/Fall risk: no    Nutritional Plan: no  Consults:          Labs/Testing due today?: no    Sleep hours:  10.15      Participation in Care/Groups:  yes  Medication Compliant?: Yes  PRNS (last 24 hours): None    Restraints (last 24 hours):  no  Substance Abuse:  yes  CIWA (range last 24 hours):  COWS (range last 24 hours):   Alcohol screening (AUDIT) completed -  AUDIT Score: 6  If applicable, date SBIRT discussed in treatment team AND documented:   Tobacco - patient is a smoker:    Date tobacco education completed by RN:   24 hour chart check complete: yes     Patient goal(s) for today:   Treatment team focus/goals: Plan to discharge tomorrow.    Progress note he has been pleasant and complaint with treatment .  Stated he had bad dreams last night.     LOS:  2  Expected LOS: plan for discharge tomorrow     Financial concerns/prescription coverage:  no  Date of last family contact:       Family requesting physician contact today:  no  Discharge plan: he will return home   Guns in the home:  no       Outpatient provider(s): [facility]     Participating treatment team members: Alease Medinayan Borenstein,  Inetta Fermoina Bishop,SW - Dr. Wallace CullensGray - Guadalupe MapleAli Smith, PharmD. - Glory BuffPam Bradshaw, RN

## 2015-12-08 NOTE — Behavioral Health Treatment Team (Signed)
PRN Medication Documentation    Specific patient behavior that led to need for PRN medication: AGITATION THREATENING VERBALLY  Staff interventions attempted prior to PRN being given: REDIRECTION  PRN medication given: gEODON/ATIVAN im  Patient response/effectiveness of PRN medication: PENDING

## 2015-12-08 NOTE — Behavioral Health Treatment Team (Signed)
Patient participated in group. Group discussion was on unit expectations and setting personal goals. Patient personal [name] Richardson for today was to remain and happy and get discharge soon. Patient appears to be pleasant and socially appropriate with his peers.

## 2015-12-09 MED ORDER — HYDROXYZINE 25 MG TAB
25 mg | ORAL_TABLET | Freq: Four times a day (QID) | ORAL | 1 refills | Status: AC | PRN
Start: 2015-12-09 — End: ?

## 2015-12-09 MED ORDER — CITALOPRAM 20 MG TAB
20 mg | ORAL_TABLET | Freq: Every evening | ORAL | 1 refills | Status: AC
Start: 2015-12-09 — End: ?

## 2015-12-09 MED FILL — ZOLPIDEM 10 MG TAB: 10 mg | ORAL | Qty: 1

## 2015-12-09 MED FILL — HYDROXYZINE 25 MG TAB: 25 mg | ORAL | Qty: 1

## 2015-12-09 NOTE — Discharge Summary (Signed)
PSYCHIATRIC DISCHARGE SUMMARY         IDENTIFICATION:    Patient Name  [name] Richardson   Date of Birth December 04, 1990   CSN [id number]   Medical Record Number  [id number]      Age  25 y.o.   PCP None   Admit date:  Dec 10, 2015    Discharge date: 12/09/15   Room Number  728/02  @ [facility]   Date of Service  12/09/15            TYPE OF DISCHARGE: REGULAR               CONDITION AT DISCHARGE: good       PROVISIONAL & DISCHARGE DIAGNOSES:    Problem List  Date Reviewed: 12-10-15          Codes Class    * (Principal)MDD (major depressive disorder) ICD-10-CM: F32.9  ICD-9-CM: 296.20               Active Hospital Problems    *MDD (major depressive disorder)    Intellectual Delay    DISCHARGE DIAGNOSIS:   Axis I:  SEE ABOVE  Axis II: SEE ABOVE  Axis III: SEE ABOVE  Axis IV: interpersonal conflict;  lack of structure  Axis V:  45 on admission, 55 on discharge 65 (baseline)       CC & HISTORY OF PRESENT ILLNESS:  The patient, [name] Richardson, is a 25 y.o.  BLACK OR [place] male with a past psychiatric history significant for depression and agitation , who presents at this time with complaints of (and/or evidence of) the following emotional symptoms: depression, agitation and psychotic behavior.  Additional symptomatology include alcohol abuse, anger outbursts, feeling depressed, poor concentration, problem with medication and relationship difficulties and homicidal ideation .  The above symptoms have been present for years . These symptoms are of moderate  severity. These symptoms are constant  in nature.  The patient's condition has been precipitated by problems with his father and psychosocial stressors (problems with his family  ).  Patient's condition made worse by continued illicit drug use and alcohol use as well as treatment noncompliance. UDS: positive ; BAL=0.      SOCIAL HISTORY:    Social History     Social History   ??? Marital status: SINGLE     Spouse name: N/A   ??? Number of children: N/A   ??? Years of  education: N/A     Occupational History   ??? Not on file.     Social History Main Topics   ??? Smoking status: Current Every Day Smoker     Packs/day: 0.25     Years: 11.00   ??? Smokeless tobacco: Never Used   ??? Alcohol use 1.2 - 3.0 oz/week     2 - 5 Cans of beer per week      Comment: 2 times a week   ??? Drug use: Yes     Special: Marijuana, Cocaine   ??? Sexual activity: Not on file     Other Topics Concern   ??? Not on file     Social History Narrative   ??? No narrative on file      FAMILY HISTORY:   History reviewed. No pertinent family history.          HOSPITALIZATION COURSE:    Ondra Deboard was admitted to the inpatient psychiatric unit [facility] hospital for acute psychiatric stabilization in regards to symptomatology as described in  the HPI above. The differential diagnosis at time of admission included: MDD vs. Bipolar disorder.  While on the unit Alease MedinaRyan Schar was involved in individual, group, occupational and milieu therapy.  Psychiatric medications were adjusted during this hospitalization including Celexa and Atarax.   Alease Medinayan Threats demonstrated a slow, but progressive improvement in overall condition.  Much of patient's depression appeared to be related to situational stressors,  and psychological factors.  Please see individual progress notes for more specific details regarding patient's hospitalization course.   At time of discharge, Alease MedinaRyan Weber is without significant problems of  Depression, agitation or aggression. Patient free of suicidal and homicidal ideations (appears to be at very low risk of suicide or homicide) and reports many positive predictive factors in terms of not attempting suicide or homicide. Overall presentation at time of discharge is most consistent with the diagnosis of MDD, intellectual delay. Patient with request for discharge today. Patient has maximized benefit to be derived from acute inpatient psychiatric treatment.  All members of the treatment team concur with each other in  regards to plans for discharge today per patient's request.  Patient and family are aware and in agreement with discharge and discharge plan.            LABS AND IMAGING:    Labs Reviewed   TSH 3RD GENERATION   GLUCOSE, FASTING   LIPID PANEL     No results found for: DS35, PHEN, PHENO, PHENT, DILF, DS39, PHENY, PTN, VALF2, VALAC, VALP, VALPR, DS6, CRBAM, CRBAMP, CARB2, XCRBAM  Admission on 12/06/2015, Discharged on 12/09/2015   Component Date Value Ref Range Status   ??? TSH 12/07/2015 0.88  0.36 - 3.74 uIU/mL Final   ??? Glucose 12/07/2015 75  65 - 100 MG/DL Final   ??? LIPID PROFILE 12/07/2015        Final   ??? Cholesterol, total 12/07/2015 136  <200 MG/DL Final   ??? Triglyceride 12/07/2015 76  <150 MG/DL Final   ??? HDL Cholesterol 12/07/2015 75  MG/DL Final   ??? LDL, calculated 12/07/2015 45.8  0 - 100 MG/DL Final   ??? VLDL, calculated 12/07/2015 15.2  MG/DL Final   ??? CHOL/HDL Ratio 12/07/2015 1.8  0 - 5.0   Final     No results found.                DISPOSITION:    Home. Patient to f/u with psychiatric and psychotherapy appointments. Patient is to f/u with internist as directed.                 FOLLOW-UP CARE:    Activity as tolerated  Regular Diet  Wound Care: none needed.  Follow-up Information     Follow up With Details Comments Contact Info    [facility]  On 12/14/2015 Pt reports he has an appointment at the [facility] 11/13 at 10:30  [address], [place], [place] [id number]  Hours: Open today ?? 8:30AM???5PM  Phone: [phone]                 PROGNOSIS:   Fair---- based on nature of patient's pathology/ies and treatment compliance issues.  Prognosis is greatly dependent upon patient's ability to follow up with   psychiatric/psychotherapy appointments as well as to comply with psychiatric medications as prescribed.            DISCHARGE MEDICATIONS:    Informed consent given for the use of following psychotropic medications:  Discharge Medication List as of  12/09/2015  2:05 PM      START taking these medications     Details   hydrOXYzine HCl (ATARAX) 25 mg tablet Take 1 Tab by mouth every six (6) hours as needed for Anxiety (anxiety (1st line)). Indications: anxiety, Print, Disp-15 Tab, R-1         CONTINUE these medications which have CHANGED    Details   citalopram (CELEXA) 20 mg tablet Take 1 Tab by mouth every evening. Indications: ANXIETY WITH DEPRESSION, Print, Disp-15 Tab, R-1         STOP taking these medications       hydrOXYzine pamoate (VISTARIL) 25 mg capsule Comments:   Reason for Stopping:                      A coordinated, multidisplinary treatment team round was conducted with Cordie Griceyan Sullenger---[facility]. This team consists of the nurse, psychiatric unit pharmcist, social worker and Clinical research associatewriter.     I have spent greater than 35 minutes on discharge work.    Signed:  Vita Barleyashida N Tiani Stanbery, MD  12/09/15

## 2015-12-09 NOTE — Behavioral Health Treatment Team (Signed)
Behavioral Health Transition Record to Provider    Patient Name: [name] Richardson  Date of Birth: 30-Dec-1990  Medical Record Number: [id number]  Date of Admission: 12/06/2015  Date of Discharge: 12/09/2015     Attending Provider: No att. providers found  Discharging Provider: Dr. Wallace Cullens   To contact this individual call [phone]  and ask the operator to page.  If unavailable, ask to be transferred to [facility] Provider on call. A Behavioral Health Provider will be available on call 24/7 and during holidays     Primary Care Provider: None    No Known Allergies       H&P Summary Notes      H&P by [place], [name] Richardson at 12/06/15 1708     Author:  Gilman Rochester, [name] Richardson Service:  PSYCHIATRY Author Type:  Physician    Filed:  12/06/15 1715 Date of Service:  12/06/15 1708 Status:  Signed    Editor:  Willis Rochester, [name] Richardson (Physician)             INITIAL PSYCHIATRIC EVALUATION            IDENTIFICATION:    Patient Name  [name] Richardson   Date of Birth 03-05-1990   CSN [id number]   Medical Record Number  [id number]      Age  25 y.o.   PCP None   Admit date:  12/06/2015    Room Number  728/02  @ [facility]   Date of Service  12/06/2015            HISTORY         REASON FOR HOSPITALIZATION:  CC: "". Pt admitted under a temporary detention order (TDO)  for severe depression with suicidal ideations proving to be an imminent danger to self and others and an inability to care for self.    HISTORY OF PRESENT ILLNESS:    The patient, [name] Richardson, is a 25 y.o.  BLACK OR [place] male with a past psychiatric history significant for depression and agitation , who presents at this time with complaints of (and/or evidence of) the following emotional symptoms: depression, agitation and psychotic behavior.  Additional symptomatology include alcohol abuse, anger outbursts, feeling depressed, poor concentration, problem with medication and relationship difficulties and homicidal ideation .  The above symptoms  have been present for years . These symptoms are of moderate  severity. These symptoms are constant  in nature.  The patient's condition has been precipitated by problems with his father and psychosocial stressors (problems with his family  ).  Patient's condition made worse by continued illicit drug use and alcohol use as well as treatment noncompliance. UDS: positive ; BAL=0.      ALLERGIES: No Known Allergies   MEDICATIONS PRIOR TO ADMISSION:   Prescriptions Prior to Admission   Medication Sig   ??? citalopram (CELEXA) 40 mg tablet Take 40 mg by mouth daily. Indications: ANXIETY WITH DEPRESSION   ??? hydrOXYzine pamoate (VISTARIL) 25 mg capsule Take 25 mg by mouth every eight (8) hours as needed for Anxiety. Indications: anxiety      PAST MEDICAL HISTORY:   Past Medical History:   Diagnosis Date   ??? Anxiety disorder    ??? Depression    History reviewed. No pertinent surgical history.   SOCIAL HISTORY: single and using drugs    Social History     Social History   ??? Marital status: SINGLE     Spouse name: N/A   ??? Number of children:  N/A   ??? Years of education: N/A     Occupational History   ??? Not on file.     Social History Main Topics   ??? Smoking status: Current Every Day Smoker     Packs/day: 0.25     Years: 11.00   ??? Smokeless tobacco: Never Used   ??? Alcohol use 1.2 - 3.0 oz/week     2 - 5 Cans of beer per week      Comment: 2 times a week   ??? Drug use: Yes     Special: Marijuana, Cocaine   ??? Sexual activity: Not on file     Other Topics Concern   ??? Not on file     Social History Narrative   ??? No narrative on file      FAMILY HISTORY: History reviewed. No pertinent family history.   History reviewed. No pertinent family history.    REVIEW OF SYSTEMS:   History obtained from the patient  Pertinent items are noted in the History of Present Illness.  All other Systems reviewed and are considered negative.           MENTAL STATUS EXAM & VITALS     MENTAL STATUS EXAM (MSE):     MSE FINDINGS ARE WITHIN NORMAL LIMITS (WNL) UNLESS OTHERWISE STATED BELOW. ( ALL OF THE BELOW CATEGORIES OF THE MSE HAVE BEEN REVIEWED (reviewed 12/06/2015) AND UPDATED AS DEEMED APPROPRIATE )  General Presentation age appropriate, cooperative   Orientation oriented to time, place and person   Vital Signs  See below (reviewed 12/06/2015); Vital Signs (BP, Pulse, & Temp) are within normal limits if not listed below.   Gait and Station Stable/steady, no ataxia   Musculoskeletal System No extrapyramidal symptoms (EPS); no abnormal muscular movements or Tardive Dyskinesia (TD); muscle strength and tone are within normal limits   Language No aphasia or dysarthria   Speech:  normal pitch and normal volume   Thought Processes logical; normal rate of thoughts; poor abstract reasoning/computation   Thought Associations tangential   Thought Content free of delusions   Suicidal Ideations no intention   Homicidal Ideations no plan    Mood:  irritable   Affect:  anxious   Memory recent  fair   Memory remote:  fair   Concentration/Attention:  distractable   Fund of Knowledge average   Insight:  limited   Reliability fair   Judgment:  limited          VITALS:     Patient Vitals for the past 24 hrs:   Temp Pulse Resp BP SpO2   12/06/15 1542 97.9 ??F (36.6 ??C) 60 16 123/90 100 %   12/06/15 1221 98.2 ??F (36.8 ??C) 91 18 112/86 -   12/06/15 0800 98.2 ??F (36.8 ??C) 70 18 122/89 100 %   12/06/15 0135 98.5 ??F (36.9 ??C) 69 18 112/84 100 %     Wt Readings from Last 3 Encounters:   12/06/15 68.7 kg (151 lb 8 oz)     Temp Readings from Last 3 Encounters:   12/06/15 97.9 ??F (36.6 ??C)     BP Readings from Last 3 Encounters:   12/06/15 123/90     Pulse Readings from Last 3 Encounters:   12/06/15 60            DATA     LABORATORY DATA:  Labs Reviewed - No data to display  No results found for any previous visit.     RADIOLOGY REPORTS:  No results found for this or any previous visit.No results found.           MEDICATIONS       ALL MEDICATIONS   Current Facility-Administered Medications   Medication Dose Route Frequency   ??? ziprasidone (GEODON) 20 mg in sterile water (preservative free) 1 mL injection  20 mg IntraMUSCular BID PRN   ??? OLANZapine (ZyPREXA) tablet 5 mg  5 mg Oral Q6H PRN   ??? benztropine (COGENTIN) tablet 2 mg  2 mg Oral BID PRN   ??? benztropine (COGENTIN) injection 2 mg  2 mg IntraMUSCular BID PRN   ??? LORazepam (ATIVAN) injection 2 mg  2 mg IntraMUSCular Q4H PRN   ??? LORazepam (ATIVAN) tablet 1 mg  1 mg Oral Q4H PRN   ??? zolpidem (AMBIEN) tablet 10 mg  10 mg Oral QHS PRN   ??? acetaminophen (TYLENOL) tablet 650 mg  650 mg Oral Q4H PRN   ??? ibuprofen (MOTRIN) tablet 400 mg  400 mg Oral Q8H PRN   ??? magnesium hydroxide (MILK OF MAGNESIA) 400 mg/5 mL oral suspension 30 mL  30 mL Oral DAILY PRN   ??? nicotine (NICODERM CQ) 21 mg/24 hr patch 1 Patch  1 Patch TransDERmal DAILY PRN   ??? influenza vaccine 2017-18 (3 yrs+)(PF) (FLUZONE QUAD/FLUARIX QUAD) injection 0.5 mL  0.5 mL IntraMUSCular PRIOR TO DISCHARGE      SCHEDULED MEDICATIONS  Current Facility-Administered Medications   Medication Dose Route Frequency   ??? influenza vaccine 2017-18 (3 yrs+)(PF) (FLUZONE QUAD/FLUARIX QUAD) injection 0.5 mL  0.5 mL IntraMUSCular PRIOR TO DISCHARGE                ASSESSMENT & PLAN        The patient, [name] Richardson, is a 25 y.o.  male who presents at this time for treatment of the following diagnoses:  Patient Active Hospital Problem List:   MDD (major depressive disorder) (12/06/2015)    Assessment: sadness and depression     Plan: my need antidepressant   Cocaine and Alcohol abuse           I will continue to monitor blood levels (Depakote, Tegretol, lithium, clozapine---a drug with a narrow therapeutic index= NTI) and associated labs for drug therapy implemented that require intense monitoring for toxicity as deemed appropriate based on current medication side effects and pharmacodynamically determined drug 1/2 lives.          A coordinated, multidisplinary treatment team (includes the nurse, unit pharmcist, Administrator) round was conducted for this initial evaluation with the patient present.     The following regarding medications was addressed during rounds with patient:   the risks and benefits of the proposed medication. The patient was given the opportunity to ask questions. Informed consent given to the use of the above medications.     I will continue to adjust psychiatric and non-psychiatric medications (see above "medication" section and orders section for details) as deemed appropriate & based upon diagnoses and response to treatment.     I have reviewed admission (and previous/old) labs and medical tests in the EHR and or transferring hospital documents. I will continue to order blood tests/labs and diagnostic tests as deemed appropriate and review results as they become available (see orders for details).    I have reviewed old psychiatric and medical records available in the EHR. I Will order additional psychiatric records from other institutions to further elucidate the nature of patient's psychopathology and review once available.  I will gather additional collateral information from friends, family and o/p treatment team to further elucidate the nature of patient's psychopathology and baselline level of psychiatric functioning.      ESTIMATED LENGTH OF STAY:    3       STRENGTHS:  Exercising self-direction/Resourceful, Access to housing/residential stability and Interpersonal/supportive relationships (family, friends, peers)                                        SIGNED:    Busby Rochester, [name] Richardson  12/06/2015[WF1.1]       Revision History       User Key Date/Time User Provider Type Action    > WF1.1 12/06/15 1715 [name], [name] Richardson Physician Sign              Admission Diagnosis: unspecified  depressive disorder  MDD (major depressive disorder)    * No surgery found *     Results for orders placed or performed during the hospital encounter of 12/06/15   TSH 3RD GENERATION   Result Value Ref Range    TSH 0.88 0.36 - 3.74 uIU/mL   GLUCOSE, FASTING   Result Value Ref Range    Glucose 75 65 - 100 MG/DL   LIPID PANEL   Result Value Ref Range    LIPID PROFILE          Cholesterol, total 136 <200 MG/DL    Triglyceride 76 <960 MG/DL    HDL Cholesterol 75 MG/DL    LDL, calculated 45.4 0 - 100 MG/DL    VLDL, calculated 09.8 MG/DL    CHOL/HDL Ratio 1.8 0 - 5.0         Immunizations administered during this encounter: There is no immunization history for the selected administration types on file for this patient.    Screening for Metabolic Disorders for Patients on Antipsychotic Medications  (Data obtained from the EMR)    Estimated Body Mass Index  Estimated body mass index is 19.99 kg/(m^2) as calculated from the following:    Height as of this encounter: 6\' 1"  (1.854 m).    Weight as of this encounter: 68.7 kg (151 lb 8 oz).     Vital Signs/Blood Pressure  Visit Vitals   ??? BP 126/85   ??? Pulse 90   ??? Temp 97.6 ??F (36.4 ??C)   ??? Resp 16   ??? Ht 6\' 1"  (1.854 m)   ??? Wt 68.7 kg (151 lb 8 oz)   ??? SpO2 100%   ??? BMI 19.99 kg/m2       Blood Glucose/Hemoglobin A1c  Lab Results   Component Value Date/Time    Glucose 75 12/07/2015 05:28 AM        No results found for: HBA1C, HGBE8, HBA1CEXT     Lipid Panel  Lab Results   Component Value Date/Time    Cholesterol, total 136 12/07/2015 05:28 AM    HDL Cholesterol 75 12/07/2015 05:28 AM    LDL, calculated 45.8 12/07/2015 05:28 AM    Triglyceride 76 12/07/2015 05:28 AM    CHOL/HDL Ratio 1.8 12/07/2015 05:28 AM       Discharge Diagnosis: Major Depressive Disorder     Discharge Plan: He will return to his home .   NAME:[name] Richardson  DOB: [birth year]  MRN: [id number]    The patient [name] Richardson exhibits the ability to control behavior in a less restrictive environment.  Patient's level of functioning is improving.  No  assaultive/destructive behavior has been observed for the past 24 hours.  No suicidal/homicidal threat or behavior has been observed for the past 24 hours.  There is no evidence of serious medication side effects.  Patient has not been in physical or protective restraints for at least the past 24 hours.  If weapons involved, how are they secured? No weapons involved     Is patient aware of and in agreement with discharge plan? Patient is aware of discharge and is in agreement     Arrangements for medication:no prescriptions given to patient.      Copy of discharge instructions to  provider?:  Yes, fax to [phone] &  [phone]     Arrangements for transportation home:  Medicaid taxi   Keep all follow up appointments as scheduled, continue to take prescribed medications per physician instructions.  Mental health crisis number:  911 or your local mental health crisis line number at [phone]           Discharge Medication List and Instructions:   Discharge Medication List as of 12/09/2015  2:05 PM      START taking these medications    Details   hydrOXYzine HCl (ATARAX) 25 mg tablet Take 1 Tab by mouth every six (6) hours as needed for Anxiety (anxiety (1st line)). Indications: anxiety, Print, Disp-15 Tab, R-1         CONTINUE these medications which have CHANGED    Details   citalopram (CELEXA) 20 mg tablet Take 1 Tab by mouth every evening. Indications: ANXIETY WITH DEPRESSION, Print, Disp-15 Tab, R-1         STOP taking these medications       hydrOXYzine pamoate (VISTARIL) 25 mg capsule Comments:   Reason for Stopping:               Unresulted Labs     None        To obtain results of studies pending at discharge, please contact [phone]     Follow-up Information     Follow up With Details Comments Contact Info    [facility]  On 12/14/2015 Pt reports he has an appointment at the [facility] 11/13 at 10:30  [address], [place], [place] [id number]  Hours: Open today ?? 8:30AM???5PM   Phone: [phone]          Advanced Directive:   Does the patient have an appointed surrogate decision maker? No  Does the patient have a Medical Advance Directive? No  Does the patient have a Psychiatric Advance Directive? No  If the patient does not have a surrogate or Medical Advance Directive AND Psychiatric Advance Directive, the patient was offered information on these advance directives Patient declined to complete      Patient Instructions: Please continue all medications until otherwise directed by physician.     Tobacco Cessation Discharge Plan:   Is the patient a smoker and needs referral for smoking cessation? Yes  Patient referred to the following for smoking cessation with an appointment? Refused     Patient was offered medication to assist with smoking cessation at discharge? Refused  Was education for smoking cessation added to the discharge instructions? Yes    Alcohol/Substance Abuse Discharge Plan:   Does the patient have a history of substance/alcohol abuse and requires a referral for treatment? Yes  Patient referred to the following for substance/alcohol abuse treatment with an appointment? Yes Monday 12/14/2015 at 10:30  with [facility]   Patient was offered medication to assist with alcohol cessation at discharge? No  Was education for substance/alcohol abuse added to discharge instructions? No    Patient discharged to Home; discussed with patient/caregiver and provided to the patient/caregiver either in hard copy or electronically.

## 2015-12-09 NOTE — Behavioral Health Treatment Team (Signed)
GROUP THERAPY PROGRESS NOTE    The patient [name] Richardson is participating in [employer] Meeting.    Group time: 30 minutes    Personal goal for participation: to orient the patient to the unit.    Goal orientation: successful adoption of unit rules    Group therapy participation: active    Therapeutic interventions reviewed and discussed: Yes    Impression of participation:     [name] MerrittsShawn Richardson  12/09/2015 9:36 AM

## 2015-12-09 NOTE — Other (Addendum)
Behavioral Health Interdisciplinary Rounds     Patient Name: [name] MedinaRyan Richardson  Age: 25 y.o.  Room/Bed:  728/02  Primary Diagnosis: MDD (major depressive disorder)   Admission Status: Voluntary Commitment     Readmission within 30 days: no  Power of Attorney in place: no  Patient requires a blocked bed: no          Reason for blocked bed: N/A    VTE Prophylaxis: Not indicated  Flu vaccine given : no, TO BE GIVEN   Mobility needs/Fall risk: no    Nutritional Plan: no  Consults:          Labs/Testing due today?: no    Sleep hours:  6.5 hrs      Participation in Care/Groups:  yes  Medication Compliant?: Yes  PRNS (last 24 hours): Sleep Aid    Restraints (last 24 hours):  no  Substance Abuse:  Yes, THC & Cocaine CIWA (range last 24 hours): COWS (range last 24 hours):   Alcohol screening (AUDIT) completed -  AUDIT Score: 6  If applicable, date SBIRT discussed in treatment team AND documented:   Tobacco - patient is a smoker: yes   Date tobacco education completed by RN: 12/06/15  24 hour chart check complete: yes     Patient goal(s) for today:   Treatment team focus/goals:   Progress note he has been pleasant and compliant     LOS:  3  Expected LOS: discharge today     Financial concerns/prescription coverage:   Date of last family contact:     Family requesting physician contact today:    Discharge plan: He will return home  Guns in the home: no        Outpatient provider([facility]): [facility]    Participating treatment team members: [name] MedinaRyan Richardson,  Inetta Fermoina Bishop,SW - Dr. Wallace CullensGray -  Lorenza Burtononya Hawkins, RN

## 2015-12-09 NOTE — Behavioral Health Treatment Team (Signed)
GROUP THERAPY PROGRESS NOTE    [name] Richardson participated in the Acute Unit's afternoon Process Group, with a focus on identifying feelings, planning for the rest of the day, and preparing for discharge.     Group time: 35 minutes.     Personal goal for participation: To increase the capacity to improve one???s mood and structure.     Goal orientation: The patient will be able to identify their feelings, develop a plan for structuring their day, and discharge planning.     Group therapy participation: With prompting, this patient participated in the group.      Therapeutic interventions reviewed and discussed: The group members were asked to introduce themselves to each other and to see if they could identify an emotion they are having and/or let the group know what they want to focus on for the day as they continue to make discharge plans.     Impression of participation: The patient said he was "irritated" because he has been "waiting since 9 AM this morning for some paperwork to be completed so I can leave here."  It was acknowledged that it is oftentimes difficult to have to wait. He also acknowledged that he was also impatient because he could hardly keep from thinking about smoking a cigarette when he is discharged and off the unit."I'm ready to go." After a few minutes, he also acknowledged that he did gain some perspective from being hospitalized, "That it probably isn't a good idea to kill myself or to kill anyone else.Marland Kitchen.Marland Kitchen.I was thinking about harming someone else when I came into the hospital, so I'm glad I came." He was encouraged to continue to think about his options when he leaves the hospital. He expressed no current SI/HI and displayed no overt psychotic symptoms in this group. He seemed to be aware that giving into impulses might have negative consequences, but like smoking, he will have to challenge himself to change and look for support.

## 2015-12-09 NOTE — Progress Notes (Signed)
Problem: Falls - Risk of  Goal: *Absence of Falls  Document Schmid Fall Risk and appropriate interventions in the flowsheet.   Fall Risk Interventions: non slip socks   Bed in low position            Medication Interventions: Teach patient to arise slowly         History of Falls Interventions: Room close to nurse's station        Problem: Depressed Mood (Adult/Pediatric)  Goal: *STG: Participates in treatment plan  Outcome: Progressing Towards Goal  Pt. Met in treatment team with Dr. Pearline Cables   Less lability  No paranoia  Discharge plans discussed  Goal: *STG: Attends activities and groups  Outcome: Progressing Towards Goal  Attending groups on the unit  Goal: *STG: Complies with medication therapy  Outcome: Progressing Towards Goal  Medication compliant  Reviewed in treatment team  Goal: Interventions  Outcome: Progressing Towards Goal  Will continue to monitor  On 15 min. Checks for safety   Assess thought process    Medications  Review discharge plans with pt.  Returning home in [place] cab    Problem: Discharge Planning  Goal: *Knowledge of discharge instructions  Outcome: Progressing Towards Goal  Pt. Discharge instructions  Reviewed   Able to verbalize to nurse

## 2015-12-09 NOTE — Behavioral Health Treatment Team (Addendum)
PRN Medication Documentation    Specific patient behavior that led to need for PRN medication: anxiety  Staff interventions attempted prior to PRN being given: Coping skills, support  PRN medication given: Atarax  Patient response/effectiveness of PRN medication: will reassess    0901- Patient response/effectiveness of PRN medication: meds effective.      1430- Pt discharged at this time via cab. Prescriptions provided and discharge instructions reviewed. Pt voiced he has a follow-up with his OP psychiatrist Nov 13. All belongings with pt at time of discharge.Nicoderm patch removed prior to release.

## 2015-12-09 NOTE — Discharge Summary (Signed)
PSYCHIATRIC DISCHARGE SUMMARY         IDENTIFICATION:    Patient Name  [name] Richardson   Date of Birth 06/19/1990   CSN [id number]   Medical Record Number  [id number]      Age  25 y.o.   PCP None   Admit date:  12/06/2015    Discharge date: 12/09/15   Room Number  728/02  @ [facility]   Date of Service  12/09/15            TYPE OF DISCHARGE: REGULAR               CONDITION AT DISCHARGE: good       PROVISIONAL & DISCHARGE DIAGNOSES:    Problem List  Date Reviewed: 12/06/2015          Codes Class    * (Principal)MDD (major depressive disorder) ICD-10-CM: F32.9  ICD-9-CM: 296.20               Active Hospital Problems    *MDD (major depressive disorder)    Intellectual Delay    DISCHARGE DIAGNOSIS:   Axis I:  SEE ABOVE  Axis II: SEE ABOVE  Axis III: SEE ABOVE  Axis IV: interpersonal conflict;  lack of structure  Axis V:  45 on admission, 55 on discharge 65 (baseline)       CC & HISTORY OF PRESENT ILLNESS:  The patient, [name] Richardson Folts, is a 25 y.o.  BLACK OR [place] male with a past psychiatric history significant for depression and agitation , who presents at this time with complaints of (and/or evidence of) the following emotional symptoms: depression, agitation and psychotic behavior.  Additional symptomatology include alcohol abuse, anger outbursts, feeling depressed, poor concentration, problem with medication and relationship difficulties and homicidal ideation .  The above symptoms have been present for years . These symptoms are of moderate  severity. These symptoms are constant  in nature.  The patient's condition has been precipitated by problems with his father and psychosocial stressors (problems with his family  ).  Patient's condition made worse by continued illicit drug use and alcohol use as well as treatment noncompliance. UDS: positive ; BAL=0.      SOCIAL HISTORY:    Social History     Social History   ??? Marital status: SINGLE     Spouse name: N/A   ??? Number of children: N/A    ??? Years of education: N/A     Occupational History   ??? Not on file.     Social History Main Topics   ??? Smoking status: Current Every Day Smoker     Packs/day: 0.25     Years: 11.00   ??? Smokeless tobacco: Never Used   ??? Alcohol use 1.2 - 3.0 oz/week     2 - 5 Cans of beer per week      Comment: 2 times a week   ??? Drug use: Yes     Special: Marijuana, Cocaine   ??? Sexual activity: Not on file     Other Topics Concern   ??? Not on file     Social History Narrative   ??? No narrative on file      FAMILY HISTORY:   History reviewed. No pertinent family history.          HOSPITALIZATION COURSE:    [name] Richardson was [facility] for acute psychiatric stabilization in regards to symptomatology as described in  the HPI above. The differential diagnosis at time of admission included: MDD vs. Bipolar disorder.  While on the unit [name] was involved in individual, group, occupational and milieu therapy.  Psychiatric medications were adjusted during this hospitalization including Celexa and Atarax.   [name] Richardson demonstrated a slow, but progressive improvement in overall condition.  Much of patient's depression appeared to be related to situational stressors,  and psychological factors.  Please see individual progress notes for more specific details regarding patient's hospitalization course.   At time of discharge, [name] is without significant problems of  Depression, agitation or aggression. Patient free of suicidal and homicidal ideations (appears to be at very low risk of suicide or homicide) and reports many positive predictive factors in terms of not attempting suicide or homicide. Overall presentation at time of discharge is most consistent with the diagnosis of MDD, intellectual delay. Patient with request for discharge today. Patient has maximized benefit to be derived from acute inpatient psychiatric treatment.  All members of the  treatment team concur with each other in regards to plans for discharge today per patient's request.  Patient and family are aware and in agreement with discharge and discharge plan.            LABS AND IMAGING:    Labs Reviewed   TSH 3RD GENERATION   GLUCOSE, FASTING   LIPID PANEL     No results found for: DS35, PHEN, PHENO, PHENT, DILF, DS39, PHENY, PTN, VALF2, VALAC, VALP, VALPR, DS6, CRBAM, CRBAMP, CARB2, XCRBAM  Admission on 12/06/2015, Discharged on 12/09/2015   Component Date Value Ref Range Status   ??? TSH 12/07/2015 0.88  0.36 - 3.74 uIU/mL Final   ??? Glucose 12/07/2015 75  65 - 100 MG/DL Final   ??? LIPID PROFILE 12/07/2015        Final   ??? Cholesterol, total 12/07/2015 136  <200 MG/DL Final   ??? Triglyceride 12/07/2015 76  <150 MG/DL Final   ??? HDL Cholesterol 12/07/2015 75  MG/DL Final   ??? LDL, calculated 12/07/2015 45.8  0 - 100 MG/DL Final   ??? VLDL, calculated 12/07/2015 15.2  MG/DL Final   ??? CHOL/HDL Ratio 12/07/2015 1.8  0 - 5.0   Final     No results found.                DISPOSITION:    Home. Patient to f/u with psychiatric and psychotherapy appointments. Patient is to f/u with internist as directed.                 FOLLOW-UP CARE:    Activity as tolerated  Regular Diet  Wound Care: none needed.  Follow-up Information     Follow up With Details Comments Contact Info    [facility]  On 12/14/2015 Pt reports he [facility] an appointment at the [facility] 11[facility]13 at 10:30  [address] [place], [place] [address]  Hours: Open today ?? 8:30AM???5PM  Phone: [phone]                 PROGNOSIS:   Fair---- based on nature of patient's pathology/ies and treatment compliance issues.  Prognosis is greatly dependent upon patient's ability to follow up with   psychiatric/psychotherapy appointments as well as to comply with psychiatric medications as prescribed.            DISCHARGE MEDICATIONS:    Informed consent given for the use of following psychotropic medications:   Discharge Medication List as  of 12/09/2015  2:05 PM      START taking these medications    Details   hydrOXYzine HCl (ATARAX) 25 mg tablet Take 1 Tab by mouth every six (6) hours as needed for Anxiety (anxiety (1st line)). Indications: anxiety, Print, Disp-15 Tab, R-1         CONTINUE these medications which have CHANGED    Details   citalopram (CELEXA) 20 mg tablet Take 1 Tab by mouth every evening. Indications: ANXIETY WITH DEPRESSION, Print, Disp-15 Tab, R-1         STOP taking these medications       hydrOXYzine pamoate (VISTARIL) 25 mg capsule Comments:   Reason for Stopping:                      A coordinated, multidisplinary treatment team round was conducted with Cordie Griceyan Sheehy---[facility]. This team consists of the nurse, psychiatric unit pharmcist, social worker and Clinical research associatewriter.     I have spent greater than 35 minutes on discharge work.    Signed:  Vita Barleyashida N Kenia Teagarden, MD  12/09/15

## 2016-01-19 IMAGING — CR DG HAND COMPLETE 3+V*L*
3 series · 3 of 3 positions shown · non-contrast
Comparison: None.

CLINICAL DATA: Bilateral hand pain with abrasions on the right
hand. Possible seizure or fall.

EXAM:
LEFT HAND - COMPLETE 3+ VIEW

[x hand pa left]
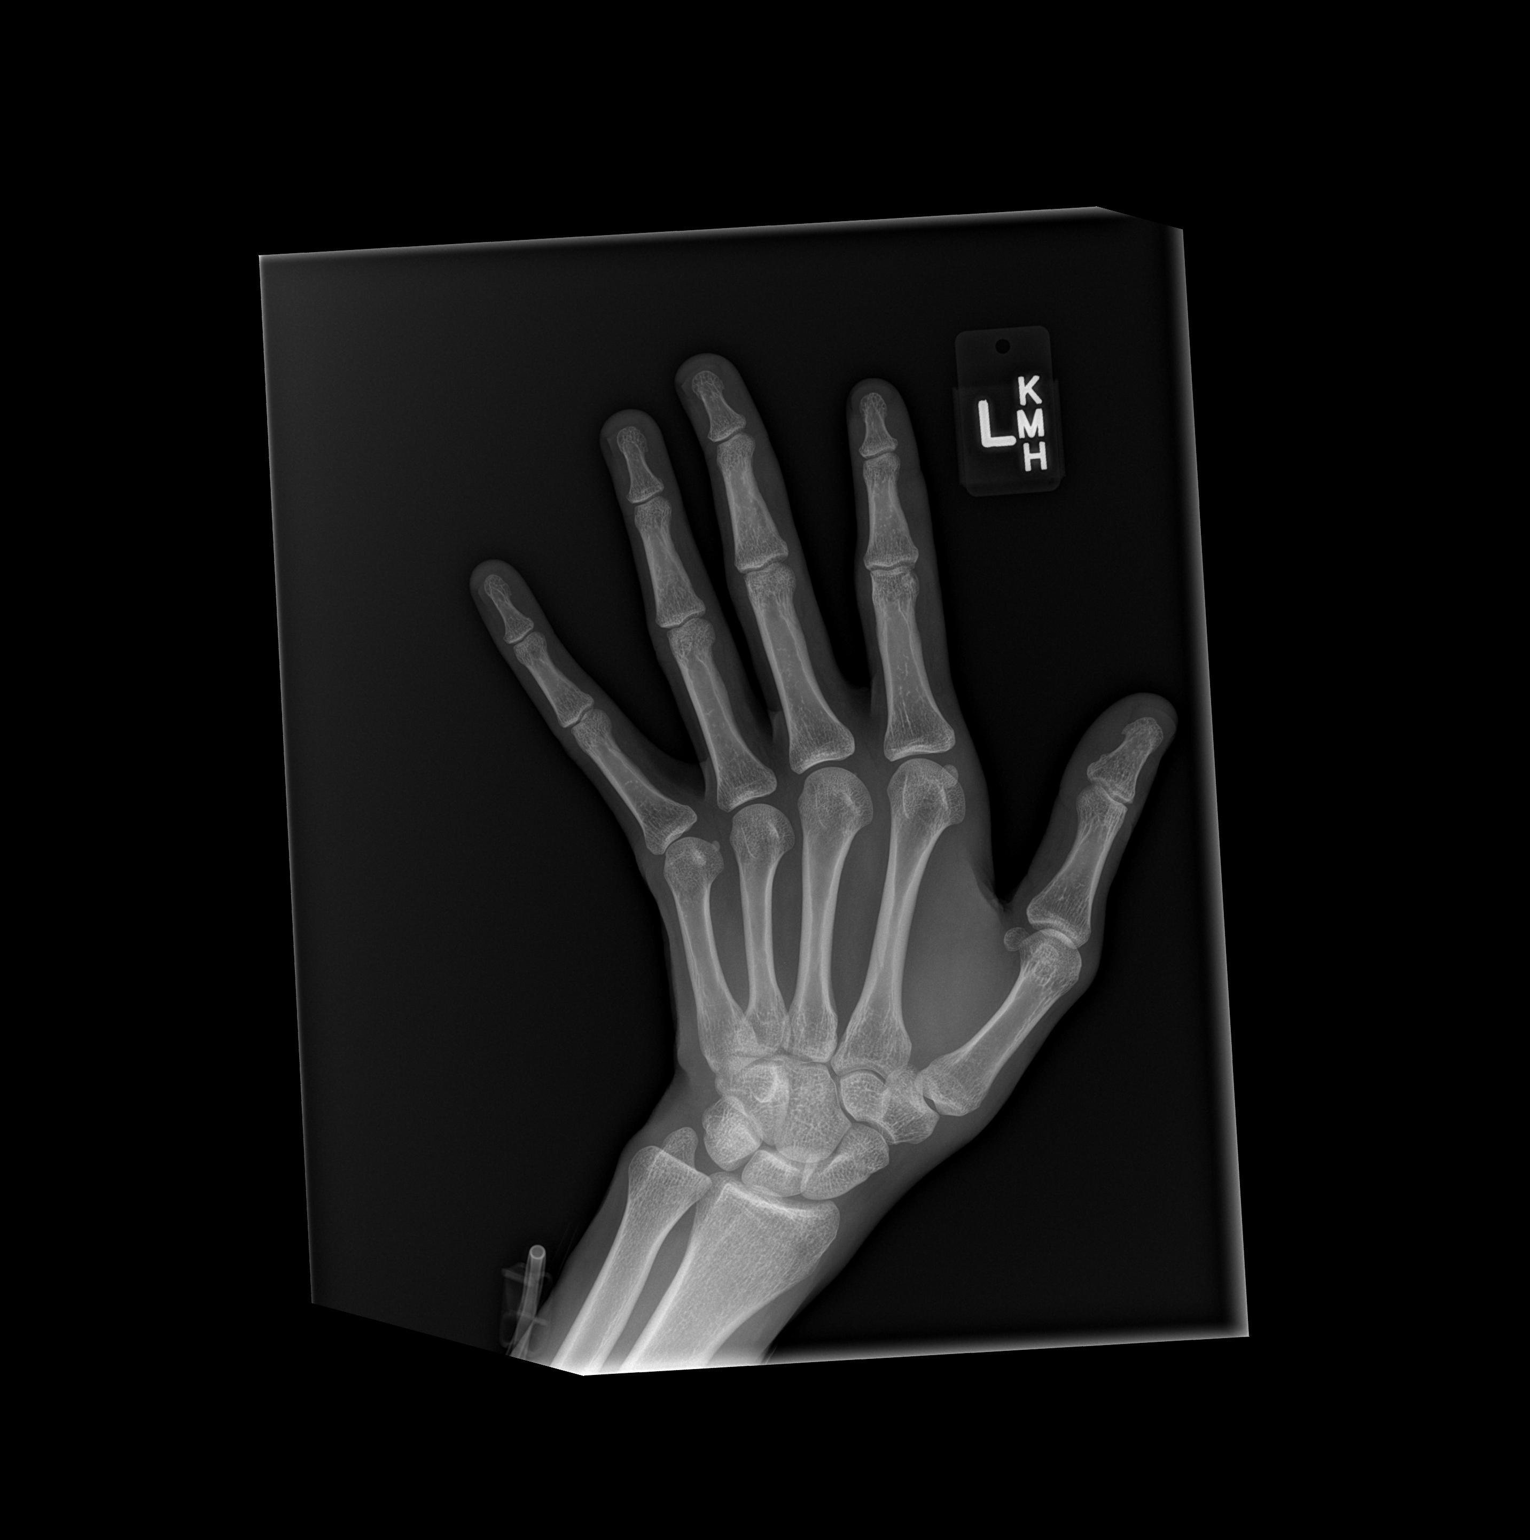

[x hand obl left]
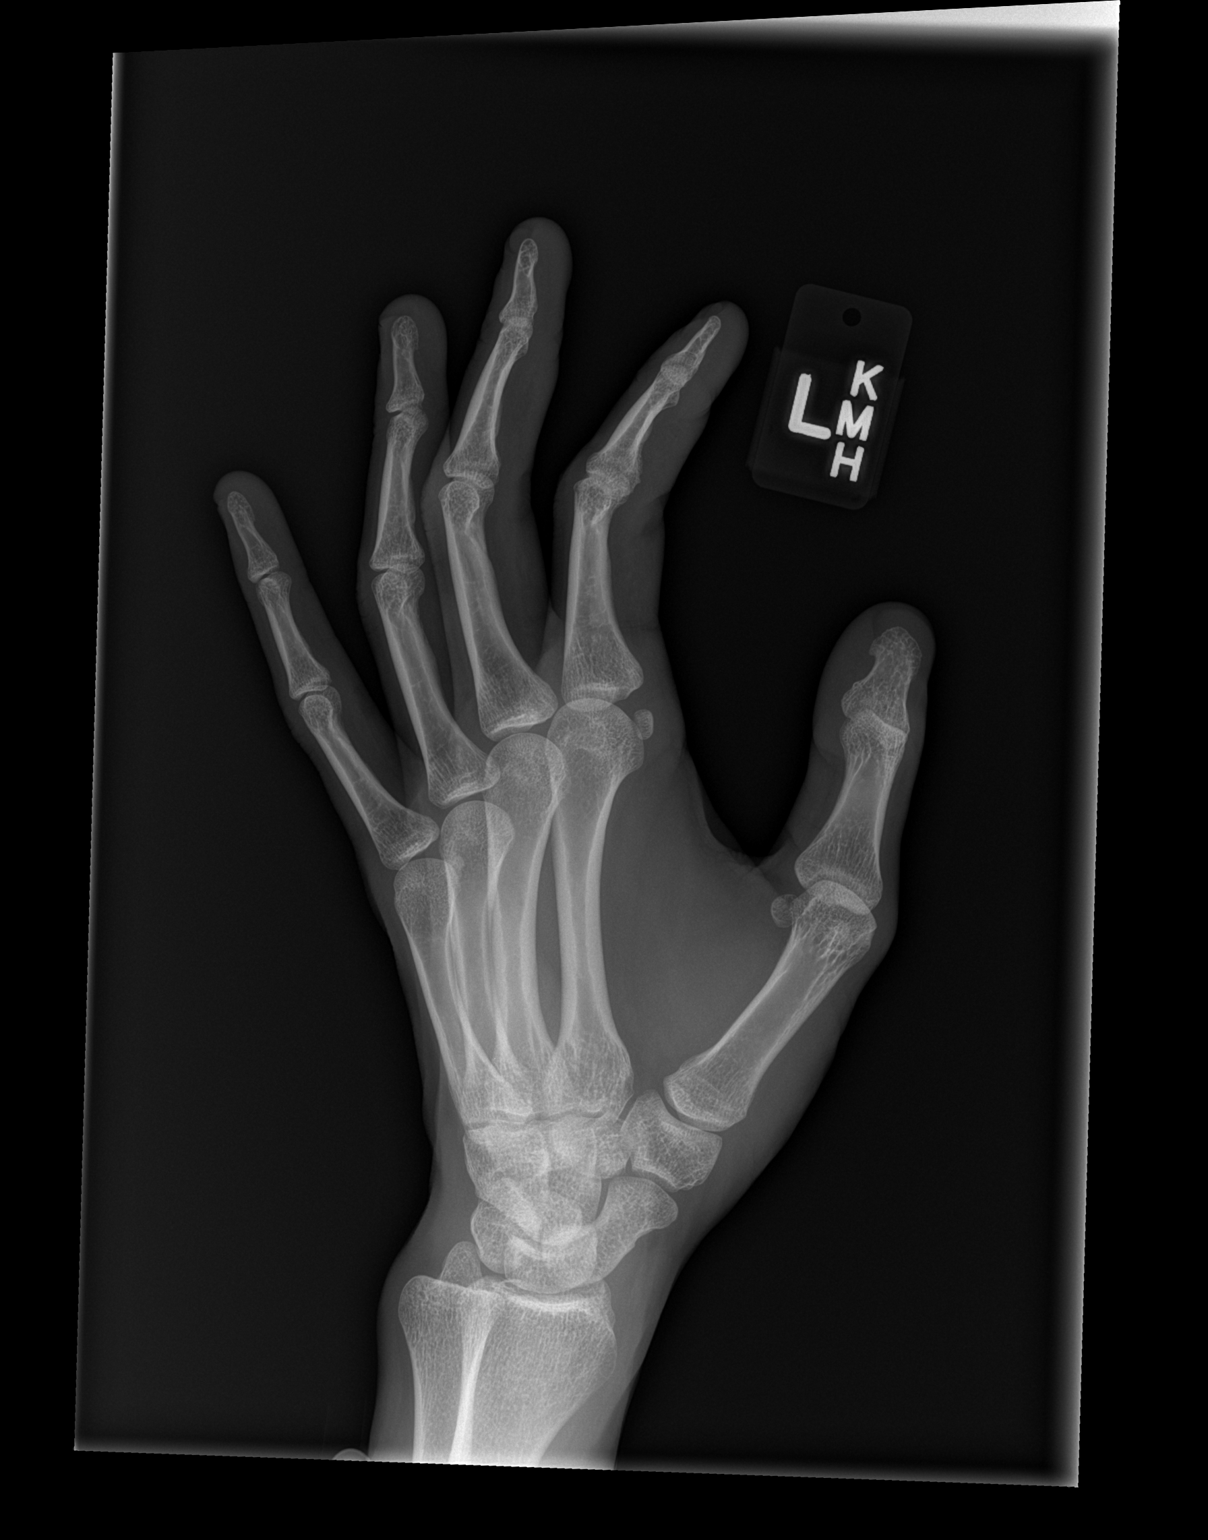

[x hand lat left]
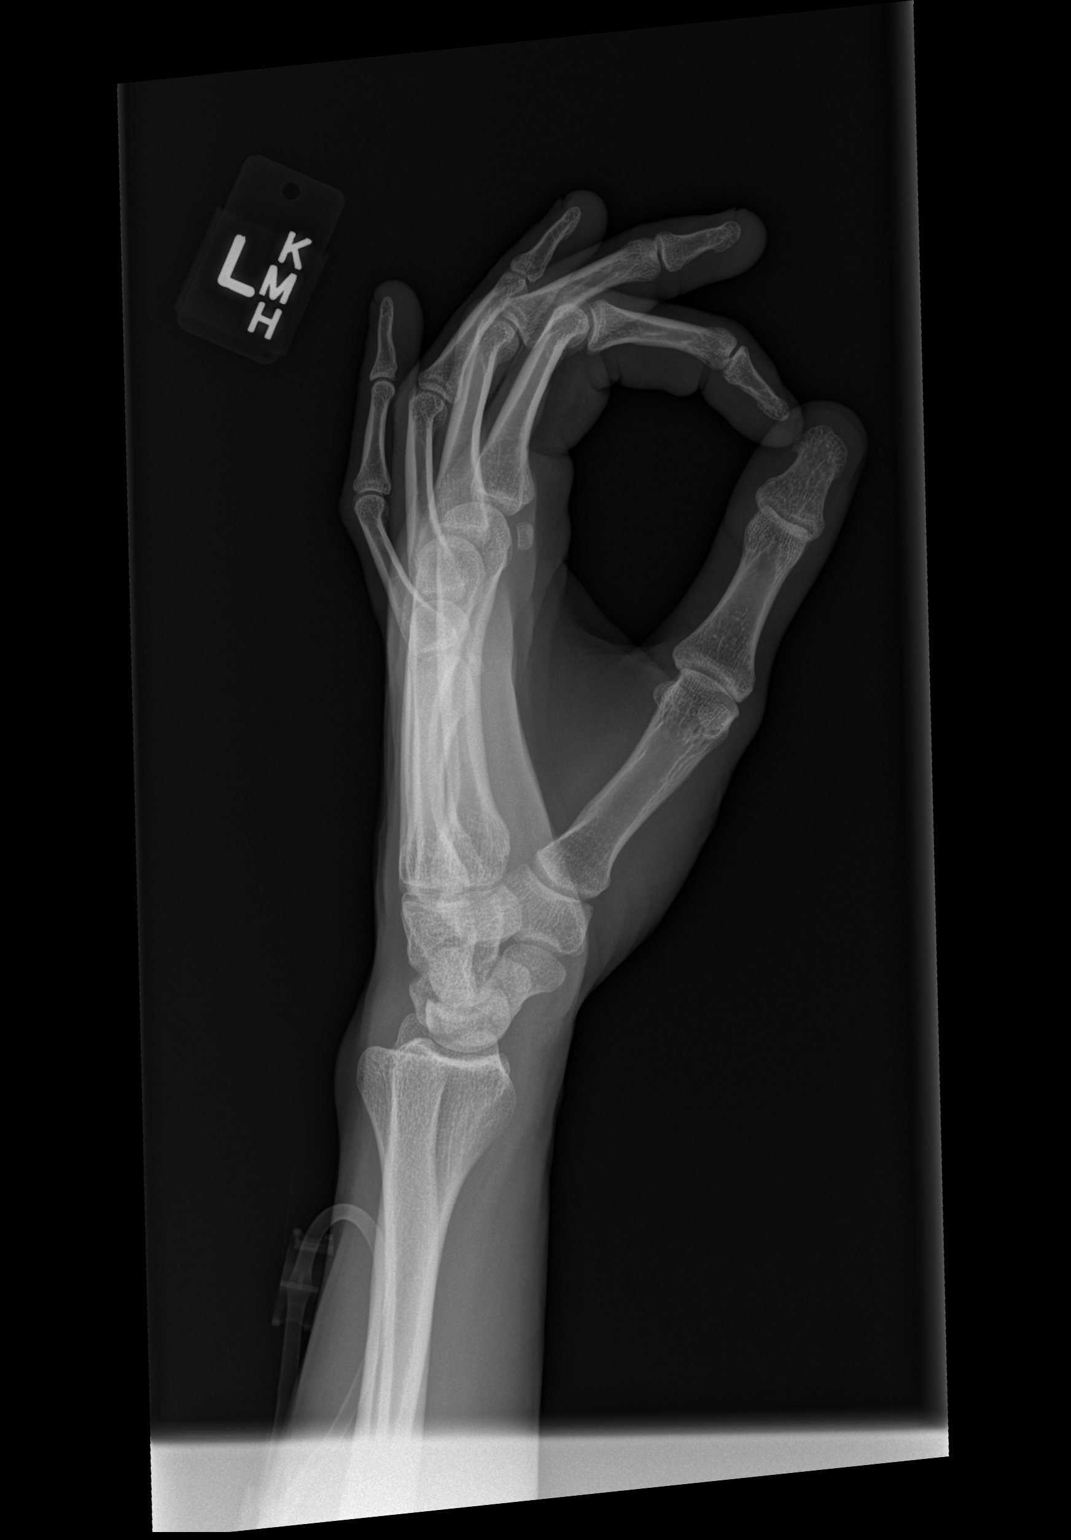

[3 of 3 positions shown; findings below may reference images not displayed]

FINDINGS: There is no evidence of fracture or dislocation. There is no
evidence of arthropathy or other focal bone abnormality. Soft
tissues are unremarkable.
IMPRESSION: Negative.

## 2016-01-19 IMAGING — CR DG HAND COMPLETE 3+V*R*
3 series · 3 of 3 positions shown · non-contrast
Comparison: None.

CLINICAL DATA: Right hand pain after injury.

EXAM:
RIGHT HAND - COMPLETE 3+ VIEW

[x hand pa right]
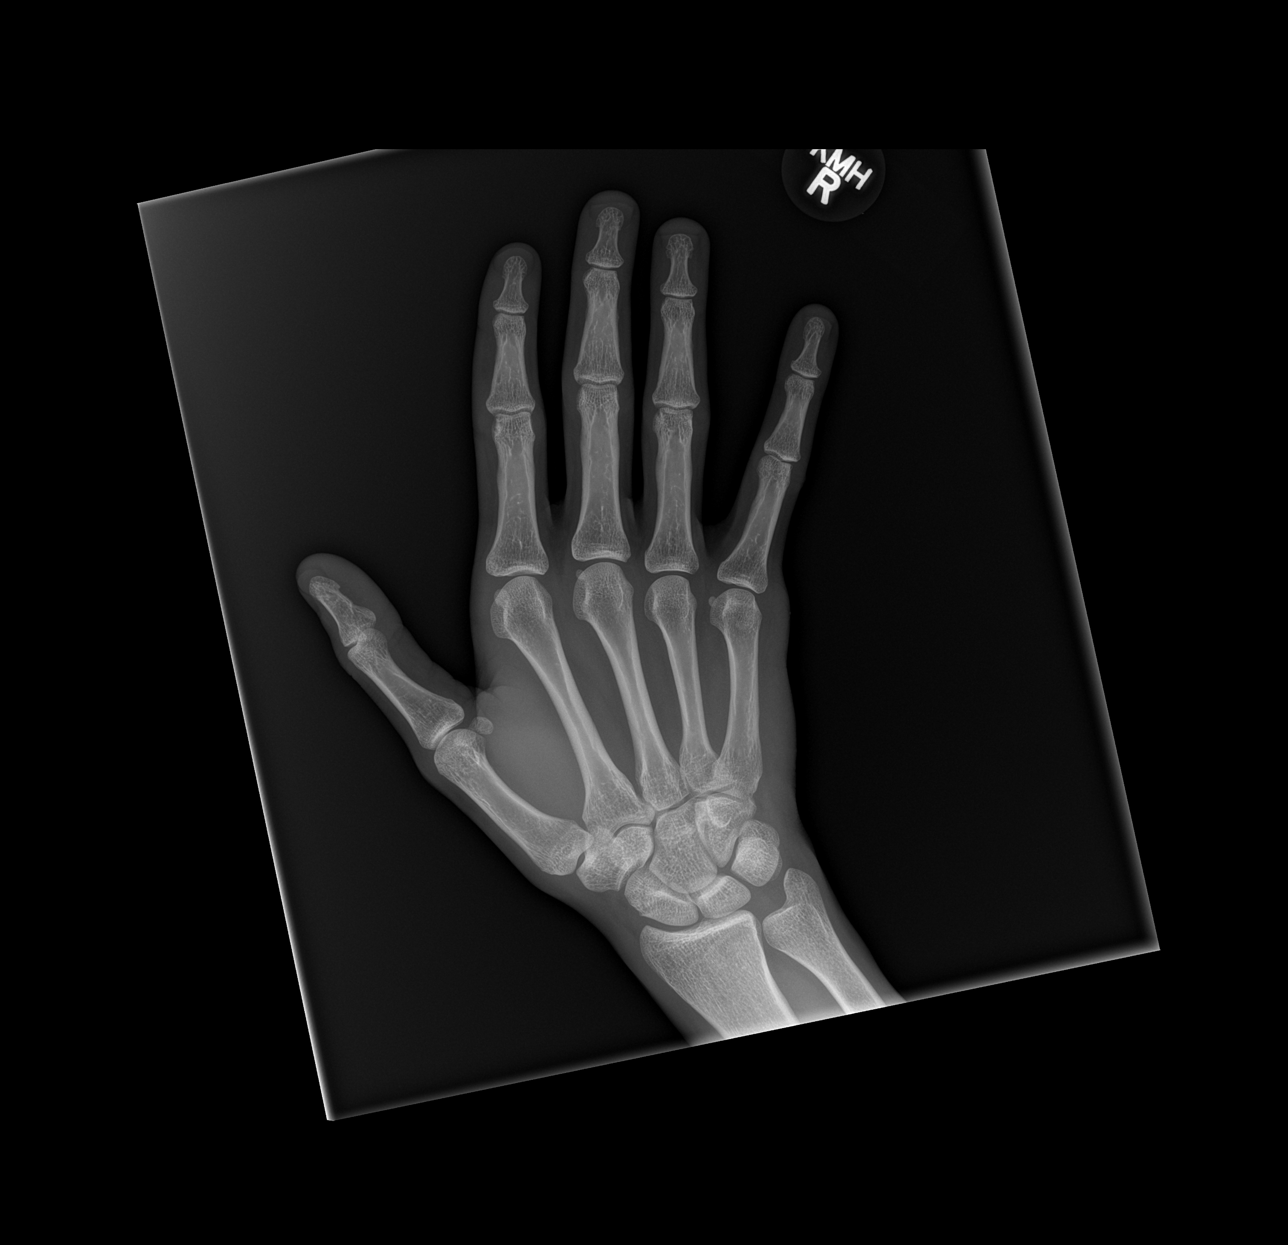

[x hand obl right]
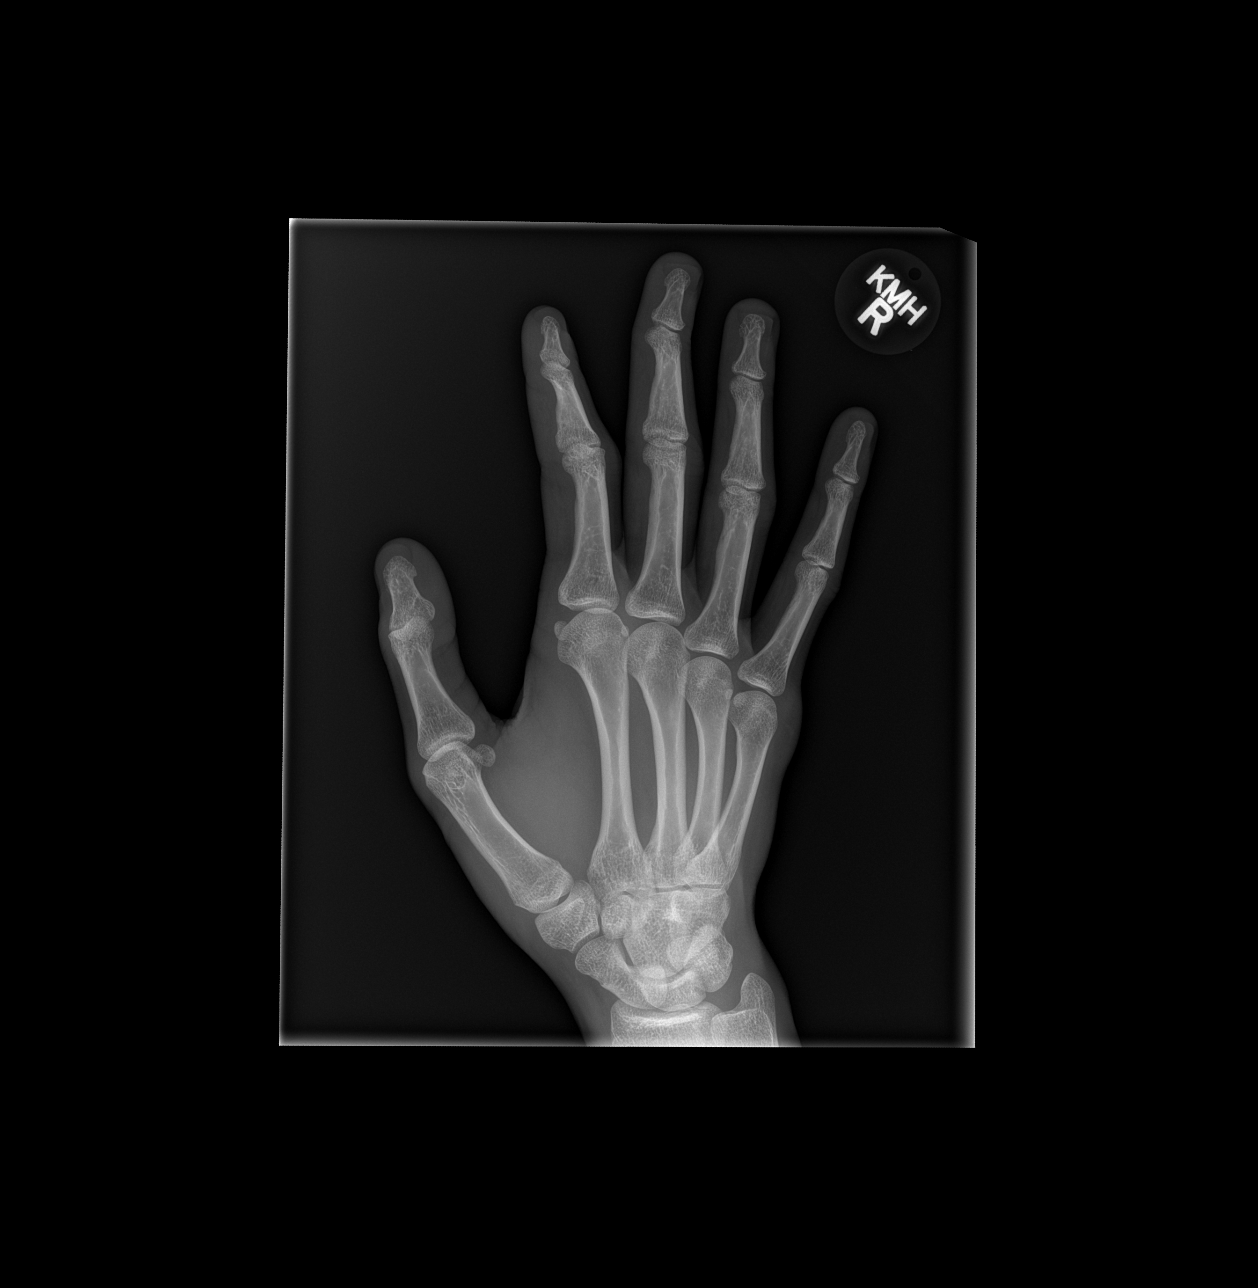

[x hand lat right]
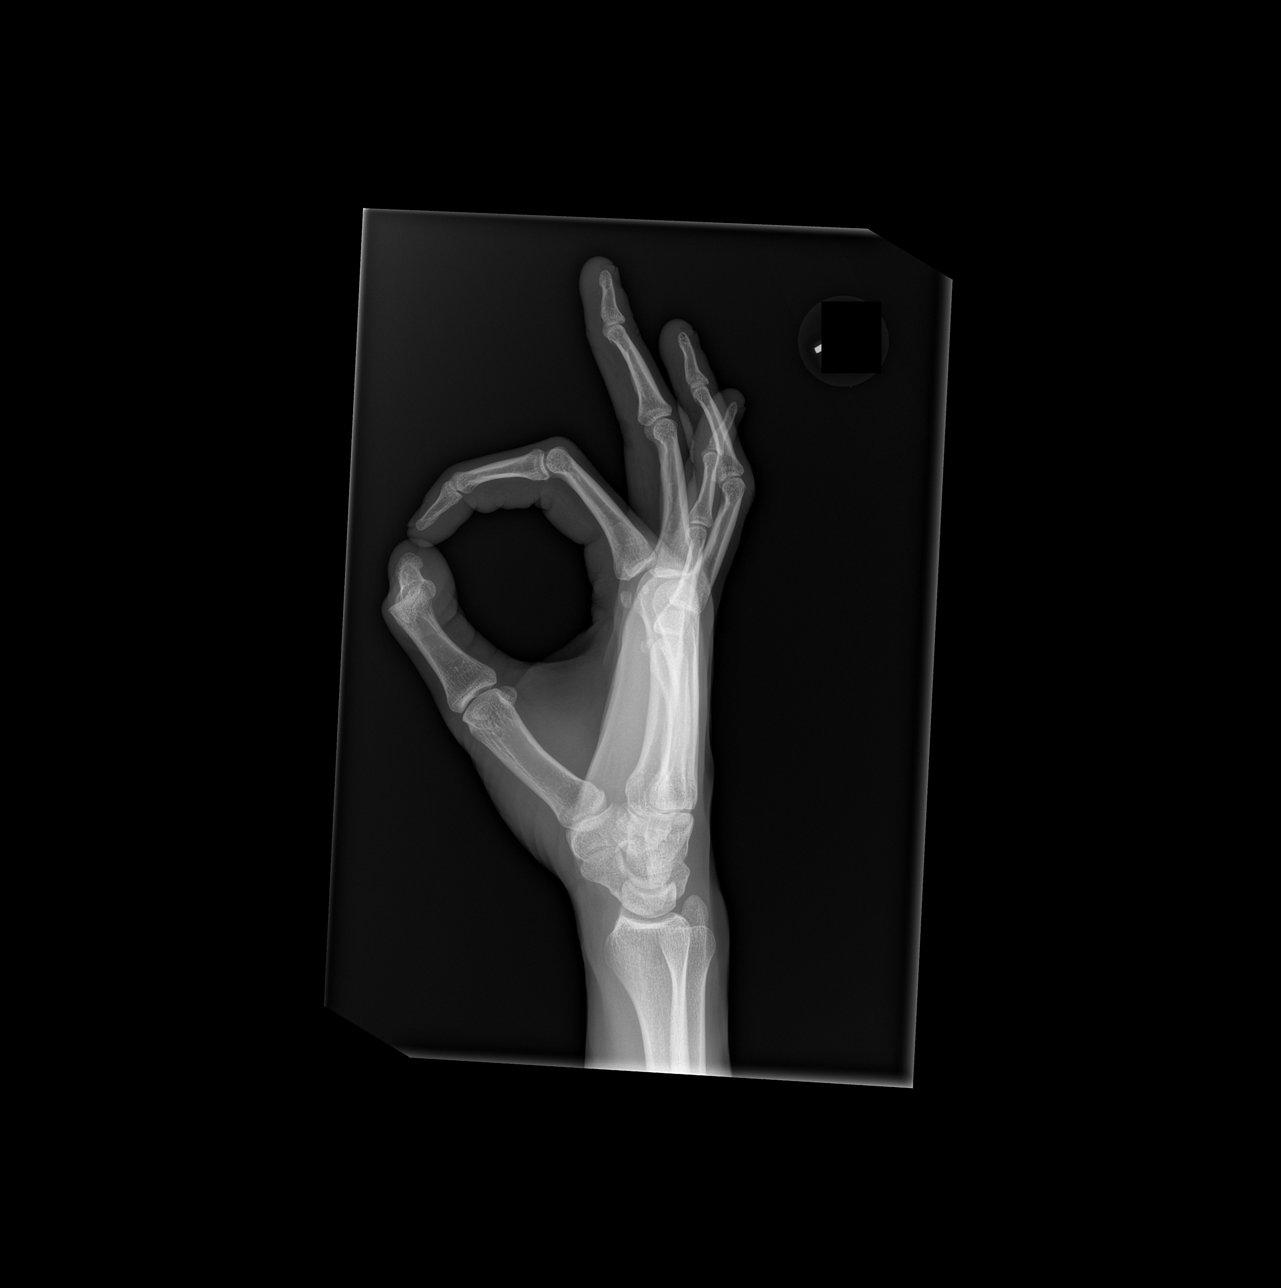

[3 of 3 positions shown; findings below may reference images not displayed]

FINDINGS: No fracture or dislocation. The alignment and joint spaces are
maintained. No radiopaque foreign body or focal soft tissue
abnormality.
IMPRESSION: No fracture or subluxation of the right hand.
